# Patient Record
Sex: Male | Born: 1955 | Race: White | Hispanic: No | Marital: Married | Smoking: Never smoker
Health system: Southern US, Community
[De-identification: ages and names within clinical notes are randomized; demographics above are authoritative.]

## PROBLEM LIST (undated history)

## (undated) DIAGNOSIS — C44310 Basal cell carcinoma of skin of unspecified parts of face: Secondary | ICD-10-CM

## (undated) DIAGNOSIS — I10 Essential (primary) hypertension: Secondary | ICD-10-CM

## (undated) DIAGNOSIS — E119 Type 2 diabetes mellitus without complications: Secondary | ICD-10-CM

## (undated) HISTORY — PX: FOOT SURGERY: SHX648

---

## 2006-09-25 ENCOUNTER — Ambulatory Visit (HOSPITAL_COMMUNITY): Admission: RE | Admit: 2006-09-25 | Discharge: 2006-09-25 | Payer: Self-pay | Admitting: Family Medicine

## 2007-10-18 ENCOUNTER — Encounter (INDEPENDENT_AMBULATORY_CARE_PROVIDER_SITE_OTHER): Payer: Self-pay | Admitting: General Surgery

## 2007-10-18 ENCOUNTER — Ambulatory Visit (HOSPITAL_COMMUNITY): Admission: RE | Admit: 2007-10-18 | Discharge: 2007-10-18 | Payer: Self-pay | Admitting: General Surgery

## 2010-07-26 NOTE — Op Note (Signed)
NAME:  Dominic Pace, Dominic Pace                 ACCOUNT NO.:  [id number]   MEDICAL RECORD NO.:  [id number]          PATIENT TYPE:  AMB   LOCATION:  DAY                           FACILITY:  [facility]   PHYSICIAN:  Tilford Pillar, MD      DATE OF BIRTH:  1955-06-10   DATE OF PROCEDURE:  10/18/2007  DATE OF DISCHARGE:                               OPERATIVE REPORT   PRIMARY CARE PHYSICIAN:  [facility].   PREOPERATIVE DIAGNOSIS:  Lipoma of the back.   POSTOPERATIVE DIAGNOSES:  Lipoma of the back.   PROCEDURE:  Excision of lipoma via a 4-cm incision.   SURGEON:  Tilford Pillar, MD   ANESTHESIA:  MAC with local anesthetic.   SPECIMEN:  Lipoma.   ESTIMATED BLOOD LOSS:  Minimal.   INDICATIONS:  The patient is a 55 year old male who presented to my  office as an outpatient with a history of a decreasing mass on his back  slightly left and midline.  This has caused increased discomfort with  lying in a supine position.  On evaluation, he had what appeared to be  clearly a lipoma.  Risks, benefits and alternatives of excision of a  lipoma were discussed at length with the patient including, but not  limited to risk of bleeding, infection, and recurrence.  The patient's  questions and concerns were addressed and the patient was consented for  the planned procedure.   OPERATION:  The patient was taken to the operating room, placed in the  supine position on the operating room table, at which time the sedation  was administered by anesthesia, he was then placed into a right lateral  decubitus position.  The back was prepped with DuraPrep solution and  drapes were placed in standard fashion.  A marking pen was utilized to  mark the planned incision over the underlying lipoma.  An incision was  created with the scalpel.  Additional dissection down his subcuticular  tissue was carried out using electrocautery.  Combination of sharp  electrocautery dissection were utilized to dissect the lipoma free  from  surrounding tissue.  The lipoma was noted to be multilobulated but was  easily able to be removed from the wound from the surrounding soft  tissue.  Once free, it was placed on the back table and was sent as  permanent specimen to pathology.  At this point, hemostasis was obtained  using excision electrocautery and a 3-0 Vicryl and a figure-of-eight  suture with excellent hemostasis.  The wound was irrigated.  The deep  subcuticular tissue was reapproximated using a 3-0 Vicryl in a simple  interrupted fashion and then the skin edges were reapproximated using a  4-0 Monocryl and running subcuticular suture.  The skin was washed and  dried with moist and dry towel.  Benzoin was applied around the  incision.  Half-inch Steri-Strips were placed.  The patient was allowed  to come out of general anesthetic, was allowed to come out of sedation,  was transferred back to a regular hospital bed and transferred to  postanesthetic care unit in stable condition.  At the conclusion of the  procedure, all instrument, sponge, and needle counts were correct.  The  patient tolerated the procedure well.      Tilford Pillar, MD  Electronically Signed     BZ/MEDQ  D:  10/18/2007  T:  10/19/2007  Job:  [id number]   cc:   [facility]

## 2010-12-09 LAB — CBC
MCHC: 33.1
MCV: 91.8
Platelets: 268
RDW: 13.4
WBC: 6.5

## 2010-12-09 LAB — BASIC METABOLIC PANEL
BUN: 11
Chloride: 100
Creatinine, Ser: 0.92
Glucose, Bld: 128 — ABNORMAL HIGH

## 2011-10-18 ENCOUNTER — Other Ambulatory Visit (HOSPITAL_COMMUNITY): Payer: Self-pay | Admitting: Cardiovascular Disease

## 2011-10-18 ENCOUNTER — Ambulatory Visit (HOSPITAL_COMMUNITY)
Admission: RE | Admit: 2011-10-18 | Discharge: 2011-10-18 | Disposition: A | Discharge status: Home / Self Care | Payer: Medicare Other | Source: Ambulatory Visit | Attending: Cardiovascular Disease | Admitting: Cardiovascular Disease

## 2011-10-18 DIAGNOSIS — R079 Chest pain, unspecified: Secondary | ICD-10-CM | POA: Insufficient documentation

## 2011-10-18 DIAGNOSIS — Z01812 Encounter for preprocedural laboratory examination: Secondary | ICD-10-CM

## 2011-10-18 DIAGNOSIS — Z01818 Encounter for other preprocedural examination: Secondary | ICD-10-CM | POA: Insufficient documentation

## 2011-10-19 ENCOUNTER — Other Ambulatory Visit: Payer: Self-pay | Admitting: Cardiovascular Disease

## 2011-10-20 ENCOUNTER — Ambulatory Visit (HOSPITAL_COMMUNITY)
Admission: RE | Admit: 2011-10-20 | Discharge: 2011-10-20 | Disposition: A | Discharge status: Home / Self Care | Payer: Medicare Other | Source: Ambulatory Visit | Attending: Cardiovascular Disease | Admitting: Cardiovascular Disease

## 2011-10-20 ENCOUNTER — Encounter (HOSPITAL_COMMUNITY)
Admission: RE | Disposition: A | Discharge status: Home / Self Care | Payer: Self-pay | Source: Ambulatory Visit | Attending: Cardiovascular Disease

## 2011-10-20 ENCOUNTER — Encounter (HOSPITAL_COMMUNITY): Payer: Self-pay | Admitting: Pharmacy Technician

## 2011-10-20 DIAGNOSIS — I10 Essential (primary) hypertension: Secondary | ICD-10-CM | POA: Insufficient documentation

## 2011-10-20 DIAGNOSIS — I200 Unstable angina: Secondary | ICD-10-CM | POA: Insufficient documentation

## 2011-10-20 DIAGNOSIS — E785 Hyperlipidemia, unspecified: Secondary | ICD-10-CM | POA: Insufficient documentation

## 2011-10-20 DIAGNOSIS — I2510 Atherosclerotic heart disease of native coronary artery without angina pectoris: Secondary | ICD-10-CM | POA: Insufficient documentation

## 2011-10-20 DIAGNOSIS — E119 Type 2 diabetes mellitus without complications: Secondary | ICD-10-CM | POA: Insufficient documentation

## 2011-10-20 HISTORY — PX: LEFT HEART CATHETERIZATION WITH CORONARY ANGIOGRAM: SHX5451

## 2011-10-20 LAB — GLUCOSE, CAPILLARY
Glucose-Capillary: 231 mg/dL — ABNORMAL HIGH (ref 70–99)
Glucose-Capillary: 230 mg/dL — ABNORMAL HIGH (ref 70–99)

## 2011-10-20 SURGERY — LEFT HEART CATHETERIZATION WITH CORONARY ANGIOGRAM
Anesthesia: LOCAL

## 2011-10-20 MED ORDER — FENTANYL CITRATE 0.05 MG/ML IJ SOLN
INTRAMUSCULAR | Status: AC
  Filled 2011-10-20: qty 2

## 2011-10-20 MED ORDER — LIDOCAINE HCL (PF) 1 % IJ SOLN
INTRAMUSCULAR | Status: AC
  Filled 2011-10-20: qty 30

## 2011-10-20 MED ORDER — SODIUM CHLORIDE 0.9 % IV SOLN: 1.0000 mL/kg/h | INTRAVENOUS | Status: DC

## 2011-10-20 MED ORDER — MIDAZOLAM HCL 2 MG/2ML IJ SOLN
INTRAMUSCULAR | Status: AC
  Filled 2011-10-20: qty 2

## 2011-10-20 MED ORDER — SODIUM CHLORIDE 0.9 % IJ SOLN: 3.0000 mL | INTRAMUSCULAR | Status: DC | PRN

## 2011-10-20 MED ORDER — HEPARIN SODIUM (PORCINE) 1000 UNIT/ML IJ SOLN
INTRAMUSCULAR | Status: AC
  Filled 2011-10-20: qty 1

## 2011-10-20 MED ORDER — SODIUM CHLORIDE 0.9 % IV SOLN
INTRAVENOUS | Status: DC
  Administered 2011-10-20: 07:00:00 via INTRAVENOUS

## 2011-10-20 MED ORDER — VERAPAMIL HCL 2.5 MG/ML IV SOLN
INTRAVENOUS | Status: AC
  Filled 2011-10-20: qty 2

## 2011-10-20 MED ORDER — ISOSORBIDE MONONITRATE ER 30 MG PO TB24: 30.0000 mg | ORAL_TABLET | Freq: Every day | ORAL | Status: DC

## 2011-10-20 MED ORDER — ONDANSETRON HCL 4 MG/2ML IJ SOLN: 4.0000 mg | Freq: Four times a day (QID) | INTRAMUSCULAR | Status: DC | PRN

## 2011-10-20 MED ORDER — ASPIRIN 81 MG PO CHEW
CHEWABLE_TABLET | ORAL | Status: AC
  Filled 2011-10-20: qty 4

## 2011-10-20 MED ORDER — ASPIRIN 81 MG PO CHEW
324.0000 mg | CHEWABLE_TABLET | ORAL | Status: AC
  Administered 2011-10-20: 324 mg via ORAL

## 2011-10-20 MED ORDER — NITROGLYCERIN 0.2 MG/ML ON CALL CATH LAB
INTRAVENOUS | Status: AC
  Filled 2011-10-20: qty 1

## 2011-10-20 MED ORDER — HEPARIN (PORCINE) IN NACL 2-0.9 UNIT/ML-% IJ SOLN
INTRAMUSCULAR | Status: AC
  Filled 2011-10-20: qty 2000

## 2011-10-20 MED ORDER — ACETAMINOPHEN 325 MG PO TABS: 650.0000 mg | ORAL_TABLET | ORAL | Status: DC | PRN

## 2011-10-20 NOTE — H&P (Signed)
  Date of Initial H&P: 10/17/2011   This procedure has been fully reviewed with the patient and written informed consent has been obtained. History reviewed, patient examined, no change in status, stable for surgery.  Thurmon Fair, MD, [facility] [phone] office [phone] pager 10/20/2011 8:01 AM

## 2011-10-20 NOTE — CV Procedure (Signed)
Dominic Pace, Dominic Pace Male, 56 y.o., [birth year]  Location: [facility]CATH [facility]  Bed: NONE  MRN: [id number]  CSN: [id number]  CARDIAC CATHETERIZATION REPORT   Procedures performed:  1. Left heart catheterization  2. Selective coronary angiography  3. Left ventriculography   Reason for procedure:  Unstable angina pectoris  Procedure performed by: Thurmon Fair, MD, [facility]  Complications: none   Estimated blood loss: less than 5 mL   History:  This is a 56 year old man with numerous coronary risk factors (diabetes mellitus, systemic hypertension, untreated next dyslipidemia) who has recurrent symptoms highly consistent with angina pectoris. The symptoms have recently shown a pattern of acceleration. Despite a previous normal nuclear perfusion study he is now referred for coronary angiography due to the accelerated pattern of his symptoms.  Consent: The risks, benefits, and details of the procedure were explained to the patient. Risks including death, MI, stroke, bleeding, limb ischemia, renal failure and allergy were described and accepted by the patient. Informed written consent was obtained prior to proceeding.  Technique: The patient was brought to the cardiac catheterization laboratory in the fasting state. He was prepped and draped in the usual sterile fashion. Local anesthesia with 1% lidocaine was administered to the right wrist area. Using the modified Seldinger technique a 5 French right radial artery sheath was introduced without difficulty. Under fluoroscopic guidance, using 5 French TIG, JL 3.5 and angled pigtail catheters, selective cannulation of the left coronary artery, right coronary artery and left ventricle were respectively performed. Several coronary angiograms in a variety of projections were recorded, as well as a left ventriculogram in the RAO projection. Left ventricular pressure and a pull back to the aorta were recorded. No immediate complications occurred. At the end of the procedure,  all catheters were removed. After the procedure, hemostasis will be achieved with manual pressure.  Contrast used: 90 mL Omnipaque Sedation with Versed 4 mg and fentanyl 50 mcg IV  Angiographic Findings:  1. The left main coronary artery is free of significant atherosclerosis and bifurcates in the usual fashion into the left anterior descending artery and left circumflex coronary artery.  2. The left anterior descending artery is a large vessel that reaches the apex and generates four major diagonal branches. The first diagonal branch is very large and has a proximal ostium and falls a trajectory consistent with a ramus intermedius artery. There is evidence of mild to moderate luminal irregularities and no calcification. No hemodynamically meaningful stenoses are seen. There is an approximately 40-50% lesion seen in the proximal LAD artery, downstream of the proximal diagonal and just before the origin of the first septal branch. It is smooth concentric and does not have the features of a typical ulcerated plaque. After administration of intra-coronary nitroglycerin there is still a residual 30-40% lesion. 3. The left circumflex coronary artery is a medium-size vessel non- dominant vessel that generates 3 major oblique marginal arteries, of which only the third is a meaningful size. There is evidence of no luminal irregularities and no calcification. No hemodynamically meaningful stenoses are seen. 4. The right coronary artery is a very large-size dominant vessel that generates a very long posterior lateral ventricular system as well as a large-caliber posterior descending artery that reaches the apex. There is evidence of minimal irregularities and no calcification. No hemodynamically meaningful stenoses are seen.  5. The left ventricle is normal in size. The left ventricle systolic function is normal, with an estimated ejection fraction of 65%. Regional wall motion abnormalities are not seen. No left  ventricular thrombus is seen. There is no mitral insufficiency. The ascending aorta appears normal. There is no aortic valve stenosis by pullback. The left ventricular end-diastolic pressure is 15 mm Hg.    IMPRESSIONS:   [name] is evidence of early coronary atherosclerosis especially noted notable in the proximal LAD artery.. However no hemodynamically important lesions are seen in more important there are no features to suggest an unstable coronary lesion. 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 He has numerous coronary risk factors and not all of these are properly addressed.  RECOMMENDATION:  Empirical treatment with nitrates for what may be endothelial dysfunction and vasospasm on top of early coronary atherosclerosis.  Aggressive treatment of his dyslipidemia with weight loss exercise improved diet and definitely a high dose effective statin.     Thurmon Fair, MD, [facility] [phone] office [phone] pager 10/20/2011 8:58 AM

## 2014-02-19 ENCOUNTER — Encounter (HOSPITAL_COMMUNITY): Payer: Self-pay | Admitting: Cardiovascular Disease

## 2014-04-02 DIAGNOSIS — R0902 Hypoxemia: Secondary | ICD-10-CM | POA: Diagnosis not present

## 2014-04-02 DIAGNOSIS — G4733 Obstructive sleep apnea (adult) (pediatric): Secondary | ICD-10-CM | POA: Diagnosis not present

## 2014-05-03 DIAGNOSIS — G4733 Obstructive sleep apnea (adult) (pediatric): Secondary | ICD-10-CM | POA: Diagnosis not present

## 2014-05-03 DIAGNOSIS — R0902 Hypoxemia: Secondary | ICD-10-CM | POA: Diagnosis not present

## 2014-05-13 DIAGNOSIS — E1165 Type 2 diabetes mellitus with hyperglycemia: Secondary | ICD-10-CM | POA: Diagnosis not present

## 2014-05-13 DIAGNOSIS — I10 Essential (primary) hypertension: Secondary | ICD-10-CM | POA: Diagnosis not present

## 2014-05-13 DIAGNOSIS — Z6841 Body Mass Index (BMI) 40.0 and over, adult: Secondary | ICD-10-CM | POA: Diagnosis not present

## 2014-06-01 DIAGNOSIS — G4733 Obstructive sleep apnea (adult) (pediatric): Secondary | ICD-10-CM | POA: Diagnosis not present

## 2014-06-01 DIAGNOSIS — R0902 Hypoxemia: Secondary | ICD-10-CM | POA: Diagnosis not present

## 2014-07-02 DIAGNOSIS — R0902 Hypoxemia: Secondary | ICD-10-CM | POA: Diagnosis not present

## 2014-07-02 DIAGNOSIS — G4733 Obstructive sleep apnea (adult) (pediatric): Secondary | ICD-10-CM | POA: Diagnosis not present

## 2014-08-01 DIAGNOSIS — G4733 Obstructive sleep apnea (adult) (pediatric): Secondary | ICD-10-CM | POA: Diagnosis not present

## 2014-08-01 DIAGNOSIS — R0902 Hypoxemia: Secondary | ICD-10-CM | POA: Diagnosis not present

## 2014-08-11 DIAGNOSIS — M5410 Radiculopathy, site unspecified: Secondary | ICD-10-CM | POA: Diagnosis not present

## 2014-08-11 DIAGNOSIS — R200 Anesthesia of skin: Secondary | ICD-10-CM | POA: Diagnosis not present

## 2014-08-11 DIAGNOSIS — E119 Type 2 diabetes mellitus without complications: Secondary | ICD-10-CM | POA: Diagnosis not present

## 2014-08-11 DIAGNOSIS — G459 Transient cerebral ischemic attack, unspecified: Secondary | ICD-10-CM | POA: Diagnosis not present

## 2014-08-19 ENCOUNTER — Ambulatory Visit (HOSPITAL_COMMUNITY)
Admission: RE | Admit: 2014-08-19 | Discharge: 2014-08-19 | Disposition: A | Discharge status: Home / Self Care | Payer: Self-pay | Source: Ambulatory Visit | Attending: Physician Assistant | Admitting: Physician Assistant

## 2014-08-19 ENCOUNTER — Other Ambulatory Visit (HOSPITAL_COMMUNITY): Payer: Self-pay | Admitting: Physician Assistant

## 2014-08-19 ENCOUNTER — Ambulatory Visit (HOSPITAL_COMMUNITY)
Admission: RE | Admit: 2014-08-19 | Discharge: 2014-08-19 | Disposition: A | Discharge status: Home / Self Care | Payer: Medicare Other | Source: Ambulatory Visit | Attending: Physician Assistant | Admitting: Physician Assistant

## 2014-08-19 DIAGNOSIS — E118 Type 2 diabetes mellitus with unspecified complications: Secondary | ICD-10-CM | POA: Diagnosis not present

## 2014-08-19 DIAGNOSIS — M5412 Radiculopathy, cervical region: Secondary | ICD-10-CM | POA: Diagnosis not present

## 2014-08-19 DIAGNOSIS — Z0001 Encounter for general adult medical examination with abnormal findings: Secondary | ICD-10-CM | POA: Diagnosis not present

## 2014-08-19 DIAGNOSIS — Z1389 Encounter for screening for other disorder: Secondary | ICD-10-CM | POA: Diagnosis not present

## 2014-08-19 DIAGNOSIS — E119 Type 2 diabetes mellitus without complications: Secondary | ICD-10-CM | POA: Diagnosis not present

## 2014-08-19 DIAGNOSIS — M47812 Spondylosis without myelopathy or radiculopathy, cervical region: Secondary | ICD-10-CM | POA: Diagnosis not present

## 2014-08-19 DIAGNOSIS — Z6839 Body mass index (BMI) 39.0-39.9, adult: Secondary | ICD-10-CM | POA: Diagnosis not present

## 2014-09-01 DIAGNOSIS — G4733 Obstructive sleep apnea (adult) (pediatric): Secondary | ICD-10-CM | POA: Diagnosis not present

## 2014-09-01 DIAGNOSIS — R0902 Hypoxemia: Secondary | ICD-10-CM | POA: Diagnosis not present

## 2015-01-14 DIAGNOSIS — Z1389 Encounter for screening for other disorder: Secondary | ICD-10-CM | POA: Diagnosis not present

## 2015-01-14 DIAGNOSIS — I10 Essential (primary) hypertension: Secondary | ICD-10-CM | POA: Diagnosis not present

## 2015-01-14 DIAGNOSIS — E782 Mixed hyperlipidemia: Secondary | ICD-10-CM | POA: Diagnosis not present

## 2015-01-14 DIAGNOSIS — E1165 Type 2 diabetes mellitus with hyperglycemia: Secondary | ICD-10-CM | POA: Diagnosis not present

## 2015-01-14 DIAGNOSIS — Z6839 Body mass index (BMI) 39.0-39.9, adult: Secondary | ICD-10-CM | POA: Diagnosis not present

## 2015-06-16 DIAGNOSIS — Z1389 Encounter for screening for other disorder: Secondary | ICD-10-CM | POA: Diagnosis not present

## 2015-06-16 DIAGNOSIS — E1121 Type 2 diabetes mellitus with diabetic nephropathy: Secondary | ICD-10-CM | POA: Diagnosis not present

## 2015-06-16 DIAGNOSIS — I10 Essential (primary) hypertension: Secondary | ICD-10-CM | POA: Diagnosis not present

## 2015-06-16 DIAGNOSIS — E1165 Type 2 diabetes mellitus with hyperglycemia: Secondary | ICD-10-CM | POA: Diagnosis not present

## 2015-08-04 DIAGNOSIS — L239 Allergic contact dermatitis, unspecified cause: Secondary | ICD-10-CM | POA: Diagnosis not present

## 2015-12-02 DIAGNOSIS — Z1389 Encounter for screening for other disorder: Secondary | ICD-10-CM | POA: Diagnosis not present

## 2015-12-02 DIAGNOSIS — H6123 Impacted cerumen, bilateral: Secondary | ICD-10-CM | POA: Diagnosis not present

## 2015-12-02 DIAGNOSIS — I10 Essential (primary) hypertension: Secondary | ICD-10-CM | POA: Diagnosis not present

## 2015-12-02 DIAGNOSIS — E1129 Type 2 diabetes mellitus with other diabetic kidney complication: Secondary | ICD-10-CM | POA: Diagnosis not present

## 2015-12-02 DIAGNOSIS — E1165 Type 2 diabetes mellitus with hyperglycemia: Secondary | ICD-10-CM | POA: Diagnosis not present

## 2015-12-02 DIAGNOSIS — E782 Mixed hyperlipidemia: Secondary | ICD-10-CM | POA: Diagnosis not present

## 2016-05-18 DIAGNOSIS — I10 Essential (primary) hypertension: Secondary | ICD-10-CM | POA: Diagnosis not present

## 2016-05-18 DIAGNOSIS — E1165 Type 2 diabetes mellitus with hyperglycemia: Secondary | ICD-10-CM | POA: Diagnosis not present

## 2016-11-30 ENCOUNTER — Other Ambulatory Visit (HOSPITAL_COMMUNITY): Payer: Self-pay | Admitting: Internal Medicine

## 2016-11-30 ENCOUNTER — Ambulatory Visit (HOSPITAL_COMMUNITY)
Admission: RE | Admit: 2016-11-30 | Discharge: 2016-11-30 | Disposition: A | Discharge status: Home / Self Care | Payer: Medicare Other | Source: Ambulatory Visit | Attending: Internal Medicine | Admitting: Internal Medicine

## 2016-11-30 DIAGNOSIS — I10 Essential (primary) hypertension: Secondary | ICD-10-CM | POA: Diagnosis not present

## 2016-11-30 DIAGNOSIS — M545 Low back pain: Secondary | ICD-10-CM | POA: Insufficient documentation

## 2016-11-30 DIAGNOSIS — E1165 Type 2 diabetes mellitus with hyperglycemia: Secondary | ICD-10-CM | POA: Diagnosis not present

## 2016-11-30 DIAGNOSIS — R52 Pain, unspecified: Secondary | ICD-10-CM

## 2016-11-30 DIAGNOSIS — D485 Neoplasm of uncertain behavior of skin: Secondary | ICD-10-CM | POA: Diagnosis not present

## 2016-12-28 ENCOUNTER — Ambulatory Visit (HOSPITAL_COMMUNITY)
Admission: RE | Admit: 2016-12-28 | Discharge: 2016-12-28 | Disposition: A | Discharge status: Home / Self Care | Payer: Medicare Other | Source: Ambulatory Visit | Attending: Physician Assistant | Admitting: Physician Assistant

## 2016-12-28 ENCOUNTER — Other Ambulatory Visit (HOSPITAL_COMMUNITY): Payer: Self-pay | Admitting: Physician Assistant

## 2016-12-28 DIAGNOSIS — M542 Cervicalgia: Secondary | ICD-10-CM | POA: Diagnosis not present

## 2016-12-28 DIAGNOSIS — M25552 Pain in left hip: Secondary | ICD-10-CM

## 2016-12-28 DIAGNOSIS — Z1389 Encounter for screening for other disorder: Secondary | ICD-10-CM | POA: Insufficient documentation

## 2016-12-28 DIAGNOSIS — H6123 Impacted cerumen, bilateral: Secondary | ICD-10-CM | POA: Diagnosis not present

## 2016-12-28 DIAGNOSIS — C44319 Basal cell carcinoma of skin of other parts of face: Secondary | ICD-10-CM | POA: Diagnosis not present

## 2016-12-28 DIAGNOSIS — L821 Other seborrheic keratosis: Secondary | ICD-10-CM | POA: Diagnosis not present

## 2016-12-28 DIAGNOSIS — D225 Melanocytic nevi of trunk: Secondary | ICD-10-CM | POA: Diagnosis not present

## 2017-04-17 DIAGNOSIS — E1129 Type 2 diabetes mellitus with other diabetic kidney complication: Secondary | ICD-10-CM | POA: Diagnosis not present

## 2017-04-17 DIAGNOSIS — Z1389 Encounter for screening for other disorder: Secondary | ICD-10-CM | POA: Diagnosis not present

## 2017-04-17 DIAGNOSIS — E118 Type 2 diabetes mellitus with unspecified complications: Secondary | ICD-10-CM | POA: Diagnosis not present

## 2017-04-17 DIAGNOSIS — Z0001 Encounter for general adult medical examination with abnormal findings: Secondary | ICD-10-CM | POA: Diagnosis not present

## 2017-04-17 DIAGNOSIS — Z0000 Encounter for general adult medical examination without abnormal findings: Secondary | ICD-10-CM | POA: Diagnosis not present

## 2017-04-26 DIAGNOSIS — Z0000 Encounter for general adult medical examination without abnormal findings: Secondary | ICD-10-CM | POA: Diagnosis not present

## 2017-05-15 DIAGNOSIS — H6123 Impacted cerumen, bilateral: Secondary | ICD-10-CM | POA: Diagnosis not present

## 2017-06-12 DIAGNOSIS — M545 Low back pain: Secondary | ICD-10-CM | POA: Diagnosis not present

## 2017-06-12 DIAGNOSIS — S39012A Strain of muscle, fascia and tendon of lower back, initial encounter: Secondary | ICD-10-CM | POA: Diagnosis not present

## 2017-07-18 DIAGNOSIS — E1165 Type 2 diabetes mellitus with hyperglycemia: Secondary | ICD-10-CM | POA: Diagnosis not present

## 2017-07-18 DIAGNOSIS — M25861 Other specified joint disorders, right knee: Secondary | ICD-10-CM | POA: Diagnosis not present

## 2017-11-29 DIAGNOSIS — L989 Disorder of the skin and subcutaneous tissue, unspecified: Secondary | ICD-10-CM | POA: Diagnosis not present

## 2017-11-29 DIAGNOSIS — E119 Type 2 diabetes mellitus without complications: Secondary | ICD-10-CM | POA: Diagnosis not present

## 2018-05-14 DIAGNOSIS — Z0000 Encounter for general adult medical examination without abnormal findings: Secondary | ICD-10-CM | POA: Diagnosis not present

## 2018-05-14 DIAGNOSIS — Z1389 Encounter for screening for other disorder: Secondary | ICD-10-CM | POA: Diagnosis not present

## 2018-05-14 DIAGNOSIS — E1165 Type 2 diabetes mellitus with hyperglycemia: Secondary | ICD-10-CM | POA: Diagnosis not present

## 2018-05-14 DIAGNOSIS — Z0001 Encounter for general adult medical examination with abnormal findings: Secondary | ICD-10-CM | POA: Diagnosis not present

## 2018-08-02 ENCOUNTER — Other Ambulatory Visit: Payer: Self-pay

## 2018-08-02 NOTE — Patient Outreach (Signed)
[place] [facility]) Care Management  08/02/2018  Dominic Pace Jul 24, 1955 [id number]   Medication Adherence call to Mr. Dominic Pace HIPPA Compliant Voice message left with a call back number. [name] is showing past due on Metformin 500 mg, Glimepiride 4 mg and Lisinopril 20 mg under [place].   [place] Management Direct Dial [phone]  Fax [phone] [email or web address]

## 2018-08-14 ENCOUNTER — Other Ambulatory Visit: Payer: Self-pay

## 2018-08-14 NOTE — Patient Outreach (Signed)
[facility]) Care Management  08/14/2018  FIORE DETJEN 05-Mar-1956 [id number]   Medication Adherence call to Mr. Ajani Rineer HIPPA Compliant Voice message left with a call back number. Mr. Propes is showing past due under [place].   [place] Management Direct Dial [phone]  Fax [phone] [email or web address]

## 2018-11-15 ENCOUNTER — Other Ambulatory Visit: Payer: Self-pay

## 2018-11-15 NOTE — Patient Outreach (Signed)
[place] [facility]) Care Management  11/15/2018  MARSALIS GIULIANI [birth year] [id number]   Medication Adherence call to Mr. Macklen Whidbee HIPPA Compliant Voice message left with a call back number. Mr. Arrieta is showing past due on Lisinopril 20 mg and Glimepiride 4 mg under [place].  [place] Management Direct Dial [phone]  Fax [phone] [email or web address]

## 2018-11-19 DIAGNOSIS — H6123 Impacted cerumen, bilateral: Secondary | ICD-10-CM | POA: Diagnosis not present

## 2018-11-19 DIAGNOSIS — I10 Essential (primary) hypertension: Secondary | ICD-10-CM | POA: Diagnosis not present

## 2018-11-19 DIAGNOSIS — E1140 Type 2 diabetes mellitus with diabetic neuropathy, unspecified: Secondary | ICD-10-CM | POA: Diagnosis not present

## 2018-11-20 DIAGNOSIS — E1140 Type 2 diabetes mellitus with diabetic neuropathy, unspecified: Secondary | ICD-10-CM | POA: Diagnosis not present

## 2018-11-20 DIAGNOSIS — E1121 Type 2 diabetes mellitus with diabetic nephropathy: Secondary | ICD-10-CM | POA: Diagnosis not present

## 2019-05-10 IMAGING — DX DG LUMBAR SPINE 2-3V
3 series · 3 of 3 positions shown · non-contrast
Comparison: None

CLINICAL DATA: Low back pain.

EXAM:
LUMBAR SPINE - 2-3 VIEW

[l-spine ap]
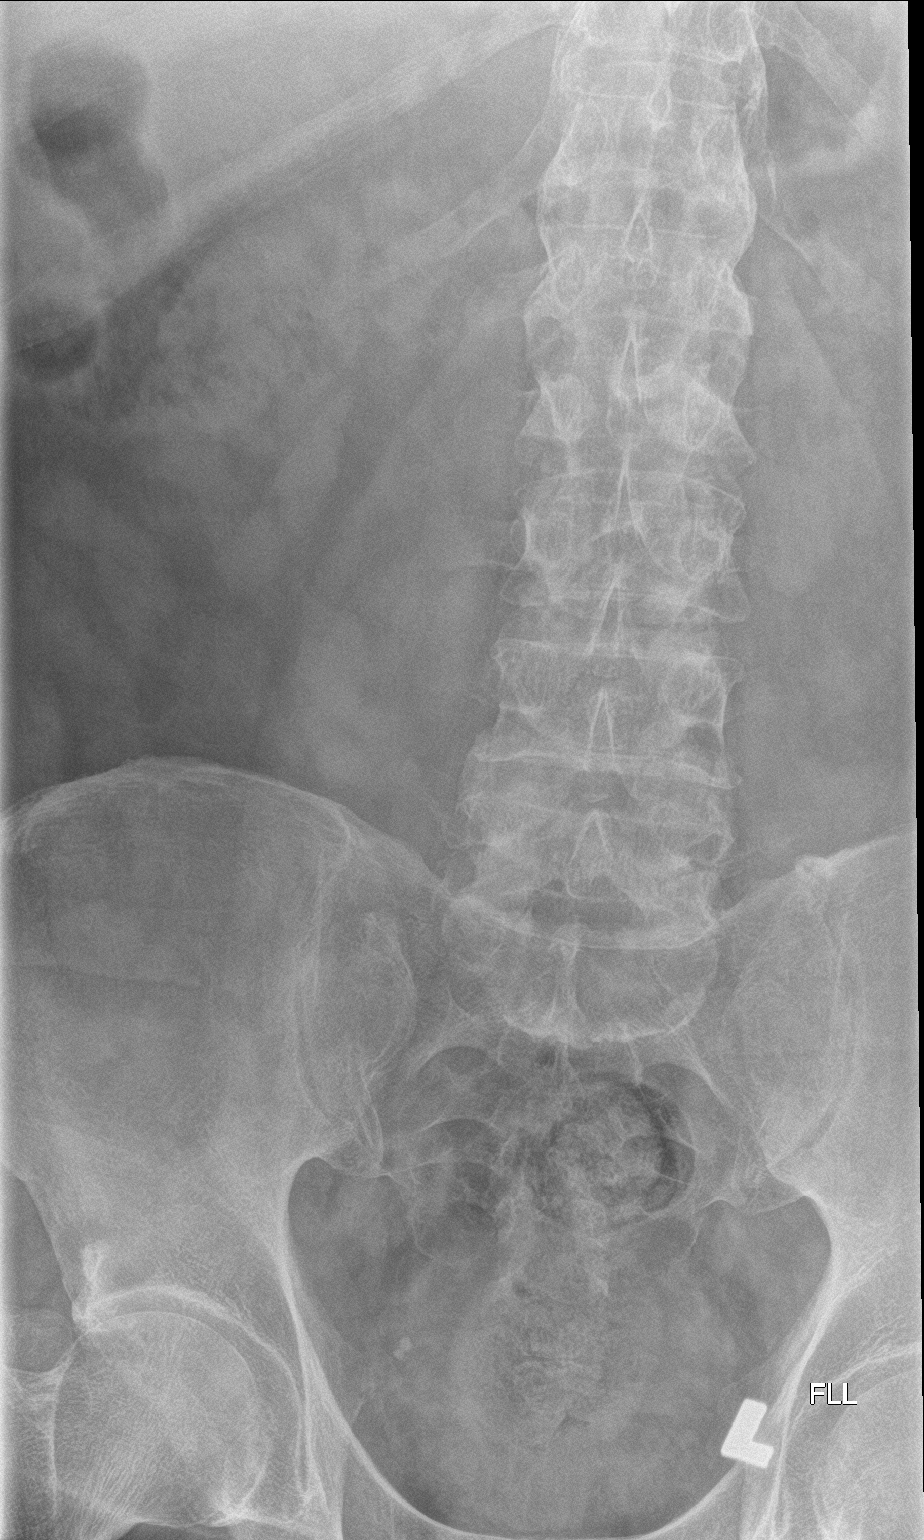

[l-spine lat]
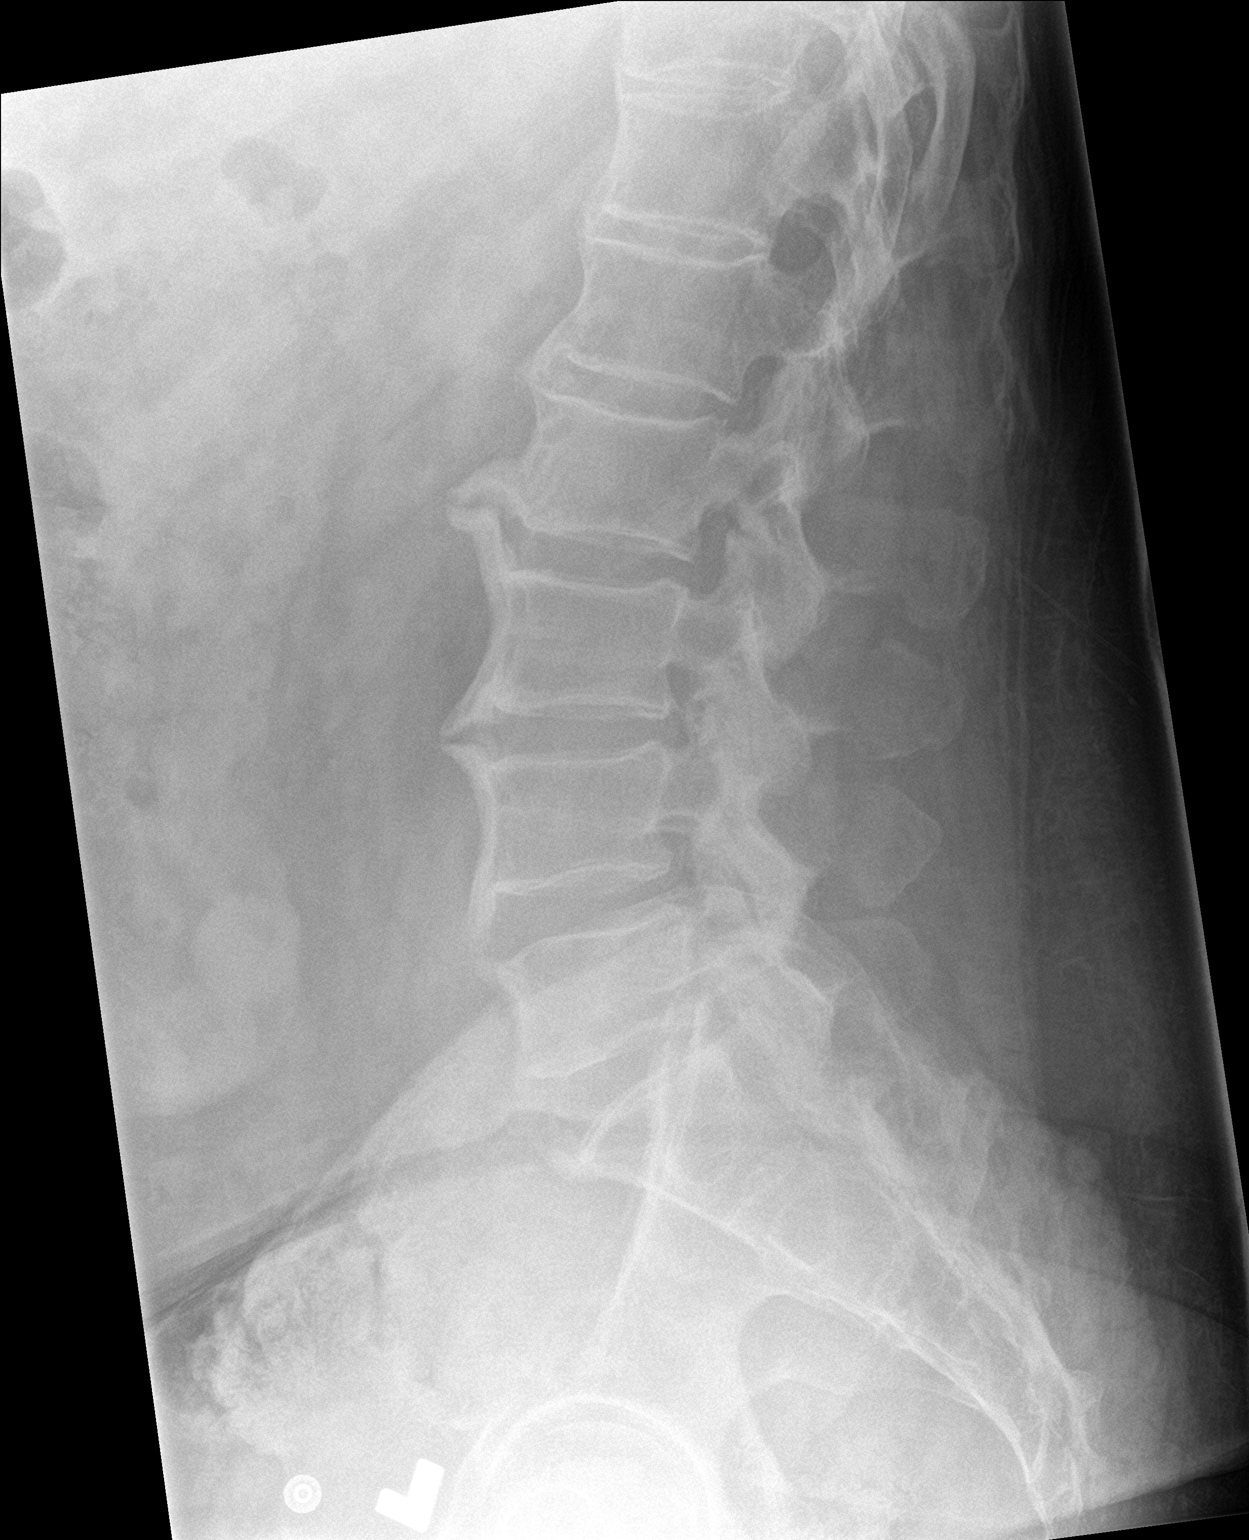

[l-spine spot]
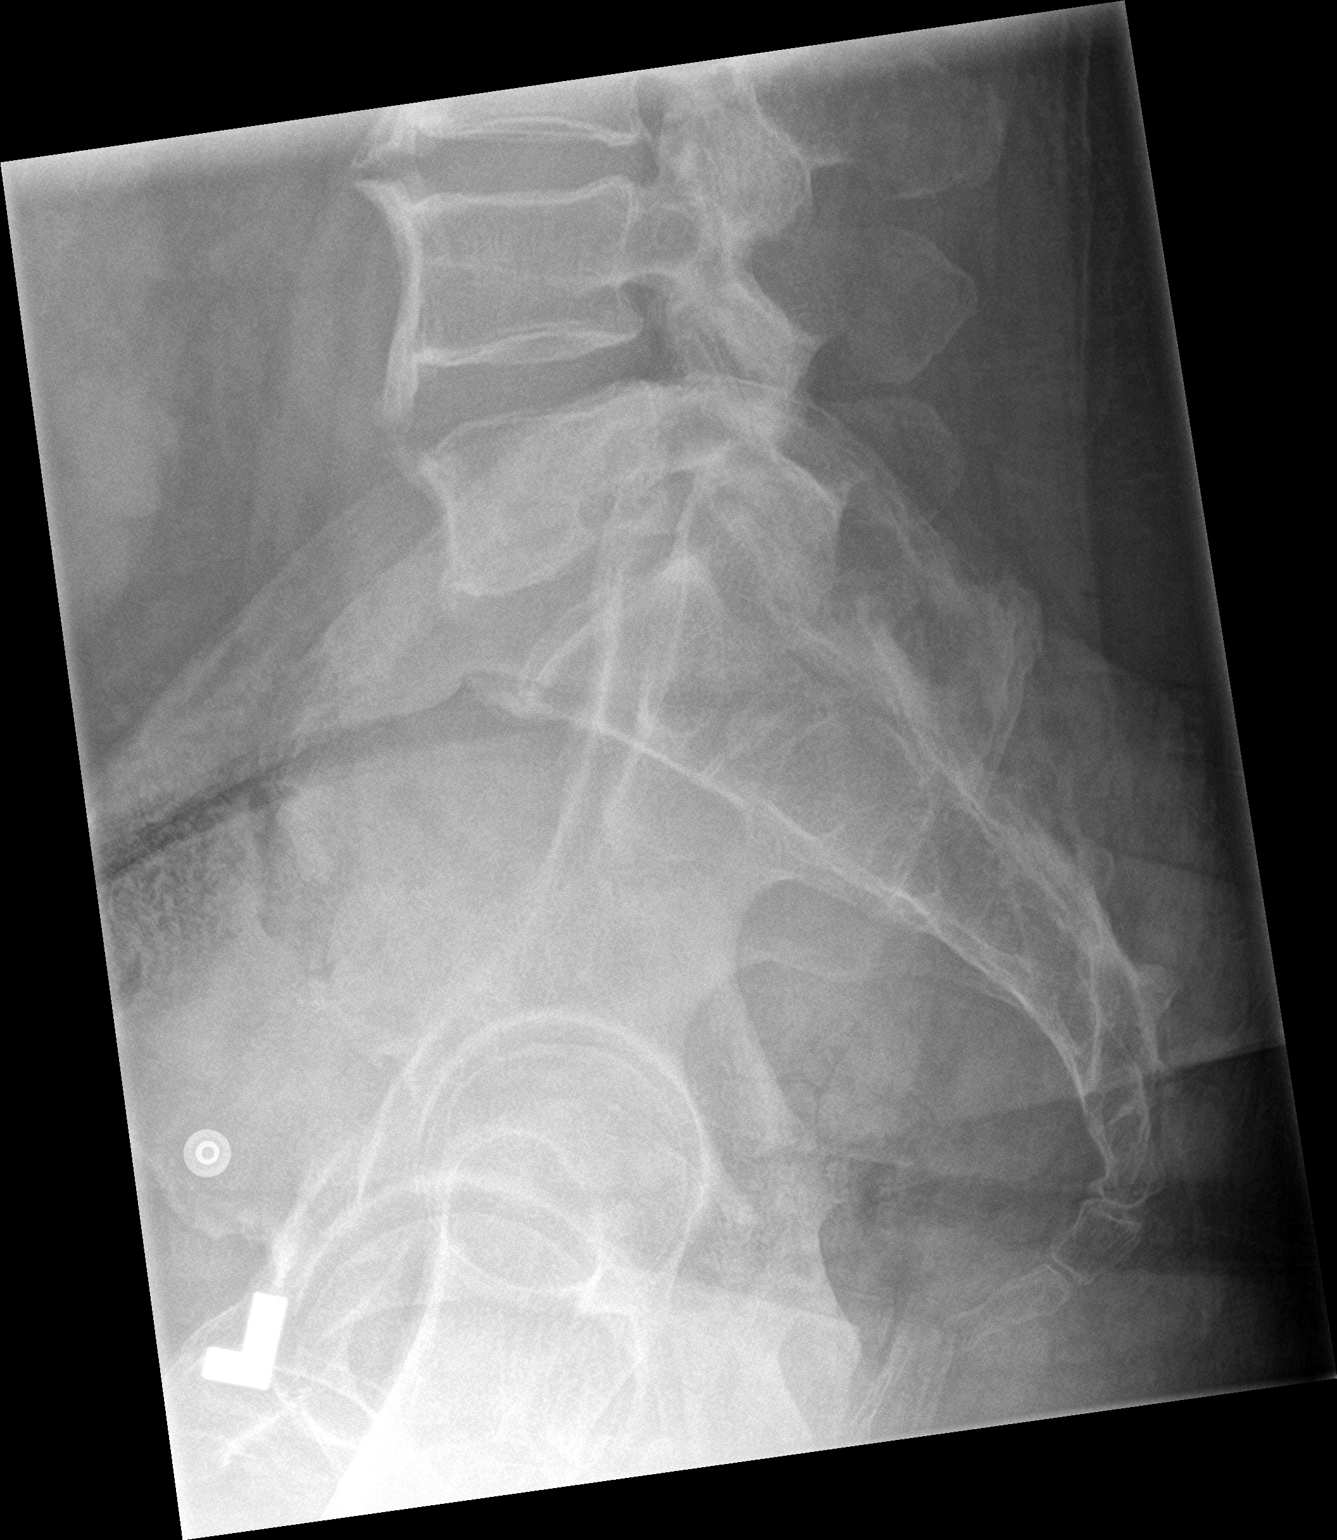

[3 of 3 positions shown; findings below may reference images not displayed]

FINDINGS: The alignment of the lumbar spine appears normal. The vertebral body
heights are well preserved. There is no fracture or subluxation.
Large ventral flowing syndesmophytes are identified within the
lumbar spine.
IMPRESSION: 1. No acute bone abnormality.
2. Disc spaces well preserved with multi level ventral flowing
syndesmophytes suggestive of DISH, diffuse idiopathic skeletal
hyperostosis.

## 2019-06-02 ENCOUNTER — Other Ambulatory Visit: Payer: Self-pay

## 2019-06-02 NOTE — Patient Outreach (Signed)
[facility] [facility]) Care Management  06/02/2019  Dominic Pace December 10, 1955 [id number]   Medication Adherence call to Dominic Pace HIPPA Compliant Voice message left with a call back number. Dominic Pace is showing past due on Lisinopril 20 mg under [place].   [place] Management Direct Dial [phone]  Fax [phone] [name]

## 2019-06-07 IMAGING — DX DG HIP (WITH OR WITHOUT PELVIS) 2-3V*L*
3 series · 4 of 4 positions shown · non-contrast
Comparison: None.

CLINICAL DATA: LEFT posterior hip pain.

EXAM:
DG HIP (WITH OR WITHOUT PELVIS) 2-3V LEFT

[Series 1: pelvis ap · 0.14mm/px · 2 of 2 slices shown]
[im 1/2]
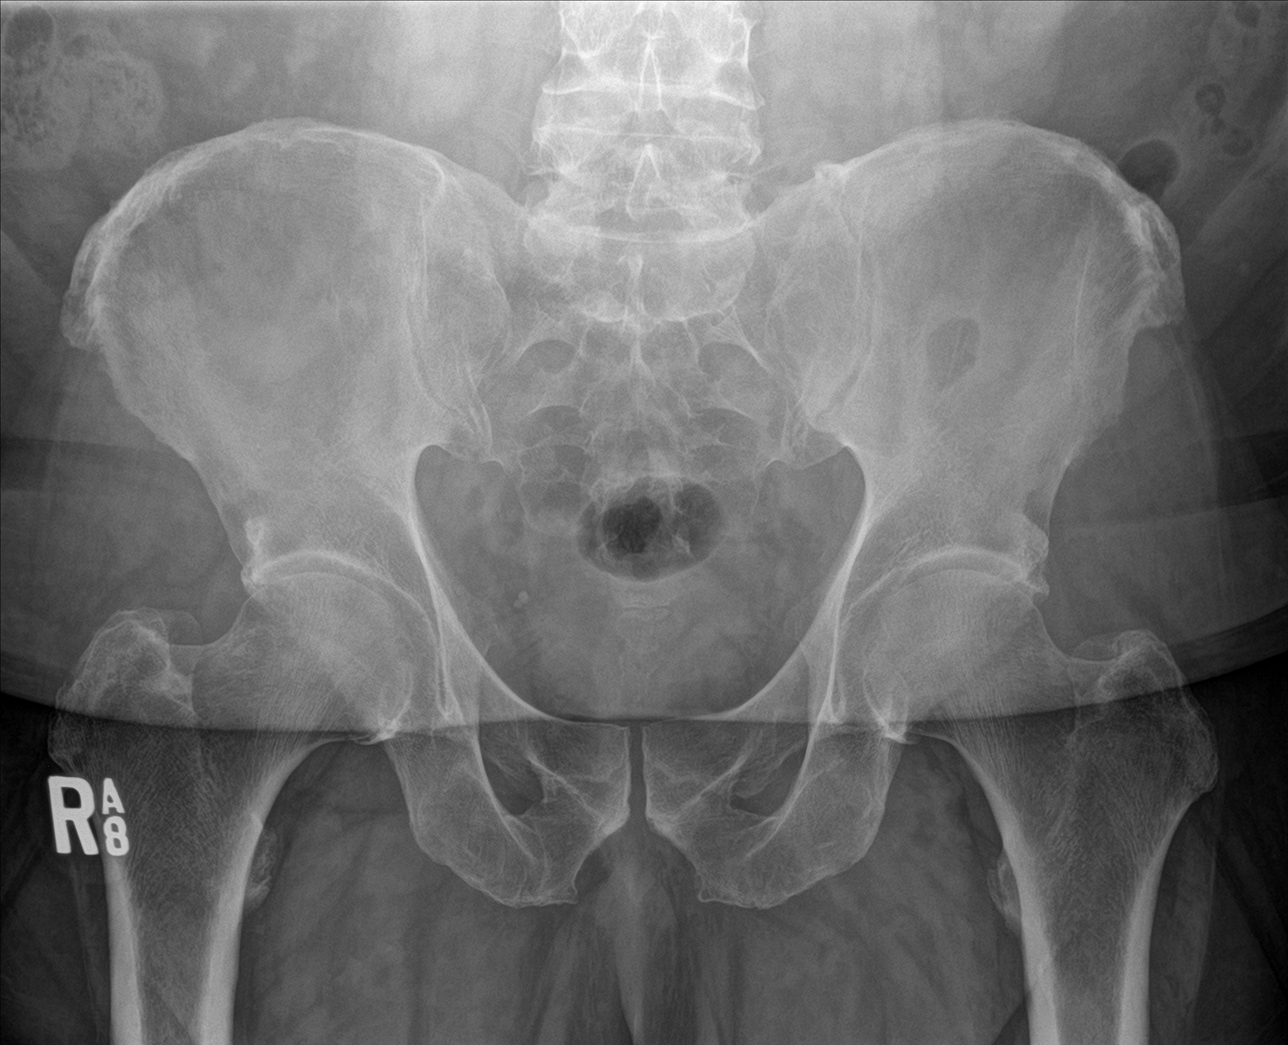
[im 2/2]
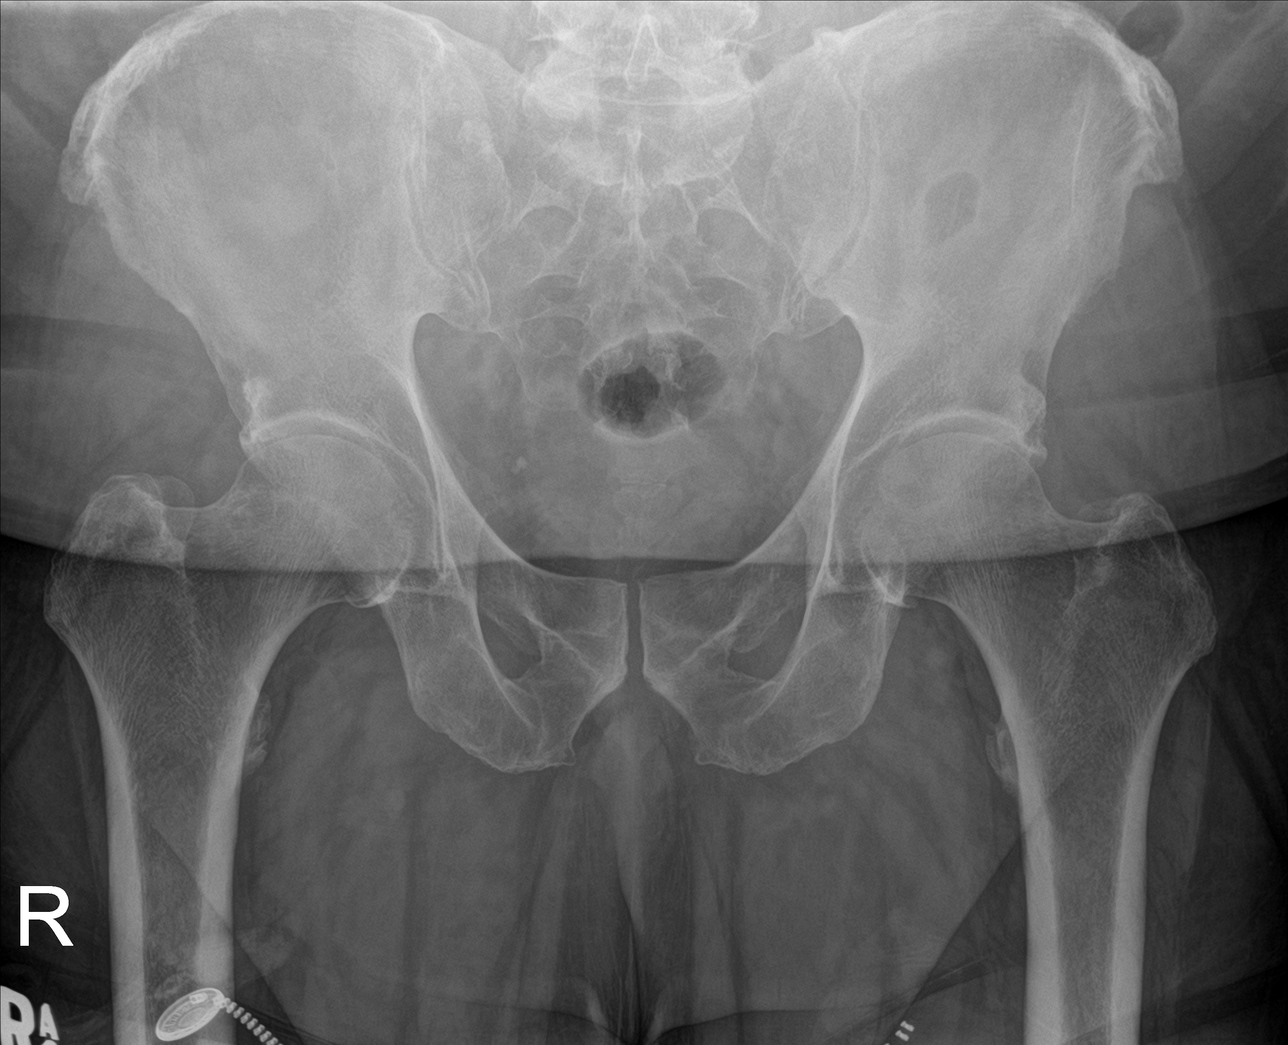

[hip ap]
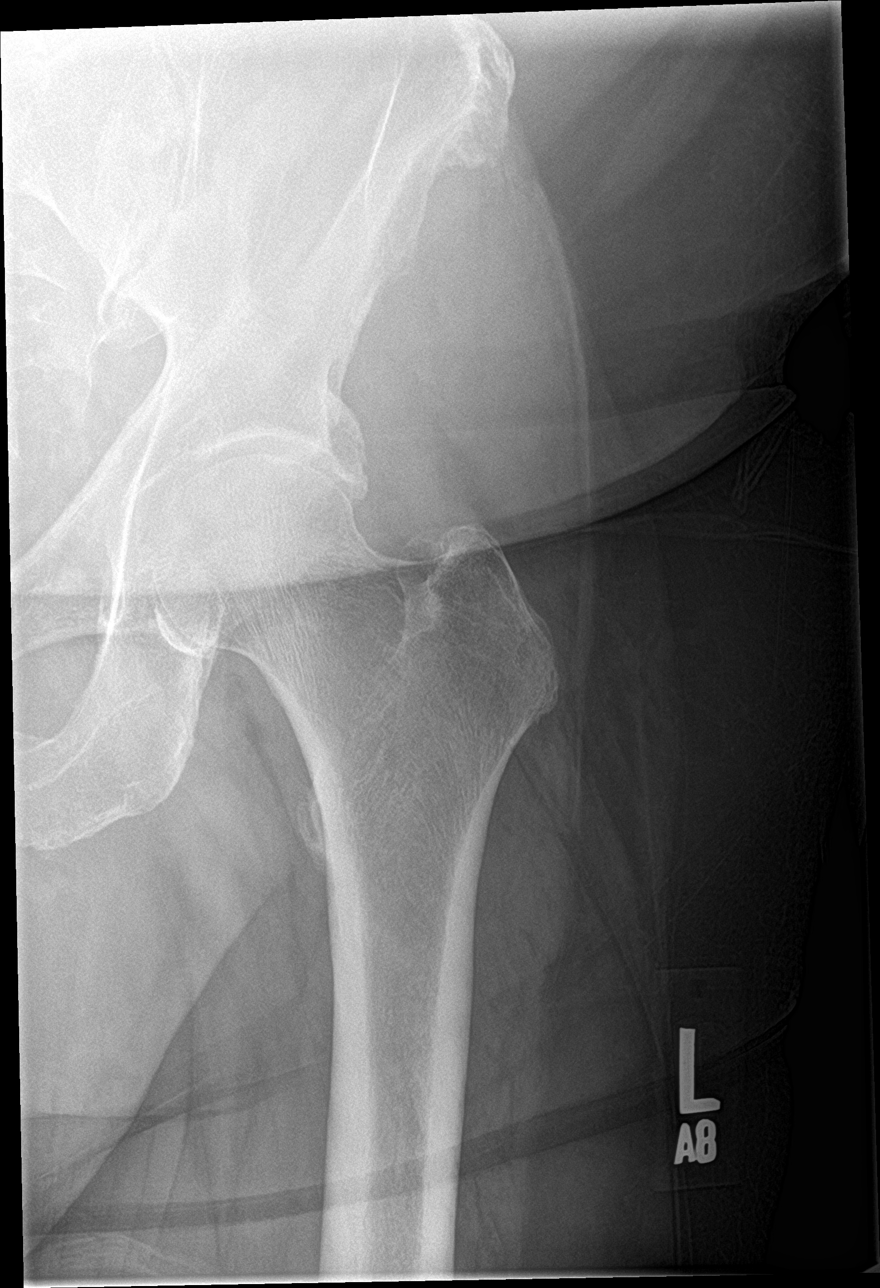

[hip lat]
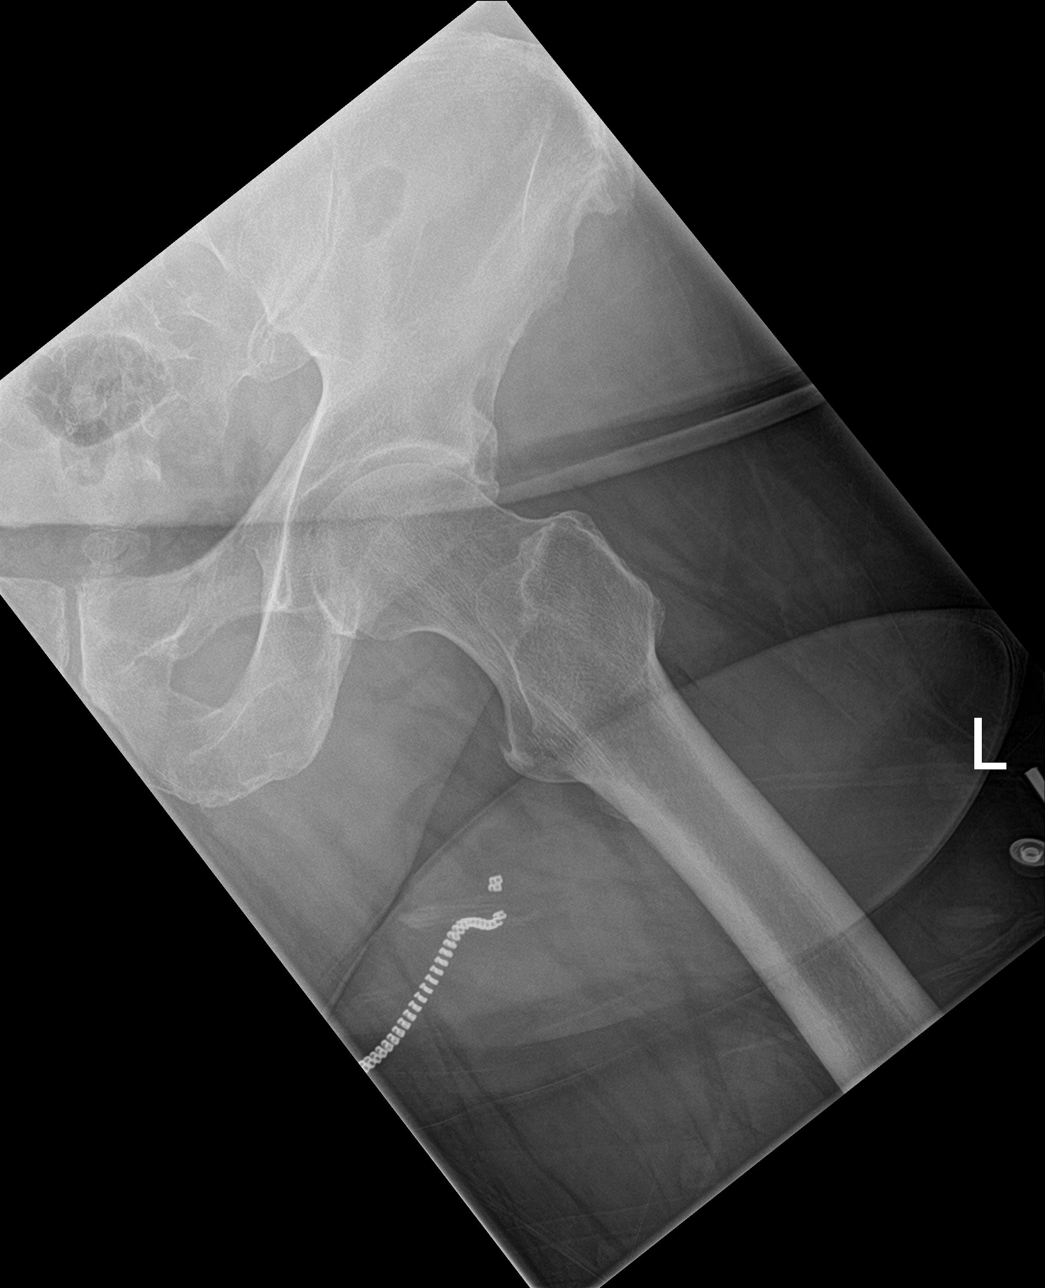

[4 of 4 positions shown; findings below may reference images not displayed]

FINDINGS: There is no evidence of hip fracture or dislocation. There is no
evidence of arthropathy or other focal bone abnormality.
IMPRESSION: Negative.

## 2020-04-19 DIAGNOSIS — Z681 Body mass index (BMI) 19 or less, adult: Secondary | ICD-10-CM | POA: Diagnosis not present

## 2020-04-19 DIAGNOSIS — J029 Acute pharyngitis, unspecified: Secondary | ICD-10-CM | POA: Diagnosis not present

## 2020-05-18 ENCOUNTER — Other Ambulatory Visit: Payer: Self-pay

## 2020-05-18 ENCOUNTER — Ambulatory Visit (INDEPENDENT_AMBULATORY_CARE_PROVIDER_SITE_OTHER): Payer: Medicare Other | Admitting: Internal Medicine

## 2020-05-18 ENCOUNTER — Encounter (INDEPENDENT_AMBULATORY_CARE_PROVIDER_SITE_OTHER): Payer: Self-pay | Admitting: Internal Medicine

## 2020-05-18 ENCOUNTER — Other Ambulatory Visit (INDEPENDENT_AMBULATORY_CARE_PROVIDER_SITE_OTHER): Payer: Self-pay

## 2020-05-18 ENCOUNTER — Telehealth (INDEPENDENT_AMBULATORY_CARE_PROVIDER_SITE_OTHER): Payer: Self-pay

## 2020-05-18 ENCOUNTER — Encounter (INDEPENDENT_AMBULATORY_CARE_PROVIDER_SITE_OTHER): Payer: Self-pay

## 2020-05-18 DIAGNOSIS — Z8719 Personal history of other diseases of the digestive system: Secondary | ICD-10-CM | POA: Insufficient documentation

## 2020-05-18 DIAGNOSIS — R1319 Other dysphagia: Secondary | ICD-10-CM

## 2020-05-18 DIAGNOSIS — E669 Obesity, unspecified: Secondary | ICD-10-CM | POA: Insufficient documentation

## 2020-05-18 DIAGNOSIS — E119 Type 2 diabetes mellitus without complications: Secondary | ICD-10-CM | POA: Insufficient documentation

## 2020-05-18 DIAGNOSIS — I10 Essential (primary) hypertension: Secondary | ICD-10-CM

## 2020-05-18 DIAGNOSIS — Z1211 Encounter for screening for malignant neoplasm of colon: Secondary | ICD-10-CM

## 2020-05-18 MED ORDER — PEG 3350-KCL-NA BICARB-NACL 420 G PO SOLR: 4000.0000 mL | ORAL | 0 refills | Status: DC

## 2020-05-18 NOTE — Patient Instructions (Signed)
Esophagogastroduodenoscopy with esophageal dilation and colonoscopy to be scheduled in near future. Remember to stop aspirin 2 days prior to procedure

## 2020-05-18 NOTE — Telephone Encounter (Signed)
LeighAnn Tidus [place], CMA  

## 2020-05-18 NOTE — Progress Notes (Signed)
Presenting complaint;  Dysphagia.  History of present illness  Patient is 65 year old Caucasian male who is referred through courtesy of Collene Mares, PA-C of [facility] for evaluation of dysphagia. Patient has history of erosive reflux esophagitis complicated by esophageal stricture for which he underwent esophageal dilation 3 times in the past most recently in March 2011 who presents with 22-month history of dysphagia.  Dysphagia has not experienced daily.  He has dysphagia primarily with liquids.  It generally occurs in the morning with the first swallow which results in fleeting chest pain.  He has no difficulty with subsequent swallows but he may have recurrence of dysphagia and chest pain later in the afternoon.  He has no difficulty with solids.  He denies coughing spells or shortness of breath with his dysphagia.  He states he ate roast yesterday and did not have any problems.  He states he has not had heartburn in at least 5 years.  He has not taken PPI in several years.  He states he took probiotic as well as prebiotic for 6 months 5 to 6 years ago and it helps with his GERD symptoms. His appetite is good.  His weight has fluctuated but has remained in narrow range this year. His bowels move regularly.  He denies melena or rectal bleeding. He is interested in screening colonoscopy.  His last exam was in March 2011. He states his peak weight for years ago was 317 pounds and he managed to lose down to 239 pounds 1 year ago.  Since then his weight has gone up to 257 pounds today.  Patient said he is not able to do much walking because of chronic foot pain.  He is planning to avail membership to [employer] which is included in his new insurance plan. Patient has not received COVID vaccine.   Current Medications: Outpatient Encounter Medications as of 05/18/2020  Medication Sig  . aspirin 81 MG EC tablet Take 81 mg by mouth daily.  Marland Kitchen glimepiride (AMARYL) 4 MG tablet Take 4 mg by mouth daily.  Marland Kitchen  lisinopril (PRINIVIL,ZESTRIL) 20 MG tablet Take 20 mg by mouth daily.  . metFORMIN (GLUCOPHAGE) 1000 MG tablet Take 1,000 mg by mouth 2 (two) times daily with a meal.  . carvedilol (COREG) 6.25 MG tablet Take 6.25 mg by mouth 2 (two) times daily with a meal. (Patient not taking: Reported on 05/18/2020)  . isosorbide mononitrate (IMDUR) 30 MG 24 hr tablet Take 1 tablet (30 mg total) by mouth daily.  . [DISCONTINUED] linagliptin (TRADJENTA) 5 MG TABS tablet Take 5 mg by mouth daily. (Patient not taking: Reported on 05/18/2020)  . [DISCONTINUED] nitroGLYCERIN (NITROSTAT) 0.4 MG SL tablet Place 0.4 mg under the tongue every 5 (five) minutes as needed. For chest pain. (Patient not taking: Reported on 05/18/2020)   No facility-administered encounter medications on file as of 05/18/2020.   Past medical history  History of erosive reflux esophagitis complicated by esophageal stricture which was dilated in October 2019, April 2010 and March 2011.  Esophageal biopsy revealed typical changes of esophagitis but no Barrett's or eosinophilic esophagitis.  Type 2 diabetes mellitus Hypertension. Obesity. Normal screening colonoscopy in March 2011. Chronic bilateral foot pain. Removal of sesamoid bone from right foot in June 1994 and redo in June 1995 Removal of sesamoid bone or bones from left foot in June 1995.  Allergies  Allergies  Allergen Reactions  . Percocet [Oxycodone-Acetaminophen] Nausea And Vomiting   Family history  Mother was diagnosed with lung carcinoma at  age 6 and died 68 years later at age 66.  He has no information about his father. He does not have any siblings.  Social history  He is married.  He has grown up son and daughter in good health. He has never smoked cigarettes and does not drink alcohol.  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  Disability ended at  retirement age of 40.  Physical examination  Blood pressure 128/78, pulse 91, temperature 98.1 F (36.7 C), temperature source Oral, height 5\' 8"  (1.727 m), weight 257 lb 1.9 oz (116.6 kg). Patient is alert and in no acute distress. He is wearing a mask. Conjunctiva is pink. Sclera is nonicteric Oropharyngeal mucosa is normal. He has upper and lower dentures in place. No neck masses or thyromegaly noted. Cardiac exam with regular rhythm normal S1 and S2. No murmur or gallop noted. Lungs are clear to auscultation. Abdomen is full but soft and nontender with organomegaly or masses. No LE edema or clubbing noted.  Labs/studies Results: Hemoglobin A1c 8.0 on 01/05/2020. No other labs available for review.  Assessment:  #1.  Esophageal dysphagia.  Patient is 65 year old Caucasian male who has history of erosive reflux esophagitis complicated by esophageal stricture balloon dilated and 2009, 2010 and 2011 who now presented with dysphagia primarily to liquids and he also has odynophagia.  Surprisingly he is not having any difficulty with solids.  Although his presentation is somewhat unusual I suspect he has recurrence of stricture and and I suspect he has esophagitis even though he is not having heartburn.  He certainly could have esophageal motility disorder.  He does not have oropharyngeal dysphagia.  #2.  Patient is average risk for colorectal carcinoma.  Last colonoscopy was 11 years ago.  Patient is interested in proceeding with colonoscopy at the time of EGD.   Recommendations  Esophagogastroduodenoscopy with esophageal dilation followed by average her screening colonoscopy near future with propofol. Procedure risks reviewed the patient and he is agreeable.

## 2020-05-18 NOTE — H&P (View-Only) (Signed)
Presenting complaint;  Dysphagia.  History of present illness  Patient is 65 year old Caucasian male who is referred through courtesy of Collene Mares, PA-C of [facility] for evaluation of dysphagia. Patient has history of erosive reflux esophagitis complicated by esophageal stricture for which he underwent esophageal dilation 3 times in the past most recently in March 2011 who presents with 22-month history of dysphagia.  Dysphagia has not experienced daily.  He has dysphagia primarily with liquids.  It generally occurs in the morning with the first swallow which results in fleeting chest pain.  He has no difficulty with subsequent swallows but he may have recurrence of dysphagia and chest pain later in the afternoon.  He has no difficulty with solids.  He denies coughing spells or shortness of breath with his dysphagia.  He states he ate roast yesterday and did not have any problems.  He states he has not had heartburn in at least 5 years.  He has not taken PPI in several years.  He states he took probiotic as well as prebiotic for 6 months 5 to 6 years ago and it helps with his GERD symptoms. His appetite is good.  His weight has fluctuated but has remained in narrow range this year. His bowels move regularly.  He denies melena or rectal bleeding. He is interested in screening colonoscopy.  His last exam was in March 2011. He states his peak weight for years ago was 317 pounds and he managed to lose down to 239 pounds 1 year ago.  Since then his weight has gone up to 257 pounds today.  Patient said he is not able to do much walking because of chronic foot pain.  He is planning to [facility] membership to [employer] which is included in his new insurance plan. Patient has not received COVID vaccine.   Current Medications: Outpatient Encounter Medications as of 05/18/2020  Medication Sig  . aspirin 81 MG EC tablet Take 81 mg by mouth daily.  Marland Kitchen glimepiride (AMARYL) 4 MG tablet Take 4 mg by mouth daily.  Marland Kitchen  lisinopril (PRINIVIL,ZESTRIL) 20 MG tablet Take 20 mg by mouth daily.  . metFORMIN (GLUCOPHAGE) 1000 MG tablet Take 1,000 mg by mouth 2 (two) times daily with a meal.  . carvedilol (COREG) 6.25 MG tablet Take 6.25 mg by mouth 2 (two) times daily with a meal. (Patient not taking: Reported on 05/18/2020)  . isosorbide mononitrate (IMDUR) 30 MG 24 hr tablet Take 1 tablet (30 mg total) by mouth daily.  . [DISCONTINUED] linagliptin (TRADJENTA) 5 MG TABS tablet Take 5 mg by mouth daily. (Patient not taking: Reported on 05/18/2020)  . [DISCONTINUED] nitroGLYCERIN (NITROSTAT) 0.4 MG SL tablet Place 0.4 mg under the tongue every 5 (five) minutes as needed. For chest pain. (Patient not taking: Reported on 05/18/2020)   No facility-administered encounter medications on file as of 05/18/2020.   Past medical history  History of erosive reflux esophagitis complicated by esophageal stricture which was dilated in October 2019, April 2010 and March 2011.  Esophageal biopsy revealed typical changes of esophagitis but no Barrett's or eosinophilic esophagitis.  Type 2 diabetes mellitus Hypertension. Obesity. Normal screening colonoscopy in March 2011. Chronic bilateral foot pain. Removal of sesamoid bone from right foot in June 1994 and redo in June 1995 Removal of sesamoid bone or bones from left foot in June 1995.  Allergies  Allergies  Allergen Reactions  . Percocet [Oxycodone-Acetaminophen] Nausea And Vomiting   Family history  Mother was diagnosed with lung carcinoma at  age 60 and died 62 years later at age 38.  He has no information about his father. He does not have any siblings.  Social history  He is married.  He has grown up son and daughter in good health. He has never smoked cigarettes and does not drink alcohol.  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  Disability ended at  retirement age of 57.  Physical examination  Blood pressure 128/78, pulse 91, temperature 98.1 F (36.7 C), temperature source Oral, height 5\' 8"  (1.727 m), weight 257 lb 1.9 oz (116.6 kg). Patient is alert and in no acute distress. He is wearing a mask. Conjunctiva is pink. Sclera is nonicteric Oropharyngeal mucosa is normal. He has upper and lower dentures in place. No neck masses or thyromegaly noted. Cardiac exam with regular rhythm normal S1 and S2. No murmur or gallop noted. Lungs are clear to auscultation. Abdomen is full but soft and nontender with organomegaly or masses. No LE edema or clubbing noted.  Labs/studies Results: Hemoglobin A1c 8.0 on 01/05/2020. No other labs available for review.  Assessment:  #1.  Esophageal dysphagia.  Patient is 65 year old Caucasian male who has history of erosive reflux esophagitis complicated by esophageal stricture balloon dilated and 2009, 2010 and 2011 who now presented with dysphagia primarily to liquids and he also has odynophagia.  Surprisingly he is not having any difficulty with solids.  Although his presentation is somewhat unusual I suspect he has recurrence of stricture and and I suspect he has esophagitis even though he is not having heartburn.  He certainly could have esophageal motility disorder.  He does not have oropharyngeal dysphagia.  #2.  Patient is average risk for colorectal carcinoma.  Last colonoscopy was 11 years ago.  Patient is interested in proceeding with colonoscopy at the time of EGD.   Recommendations  Esophagogastroduodenoscopy with esophageal dilation followed by average her screening colonoscopy near future with propofol. Procedure risks reviewed the patient and he is agreeable.

## 2020-05-28 ENCOUNTER — Encounter (HOSPITAL_COMMUNITY): Payer: Self-pay

## 2020-05-31 ENCOUNTER — Other Ambulatory Visit: Payer: Self-pay

## 2020-05-31 ENCOUNTER — Other Ambulatory Visit (HOSPITAL_COMMUNITY)
Admission: RE | Admit: 2020-05-31 | Discharge: 2020-05-31 | Disposition: A | Discharge status: Home / Self Care | Payer: Medicare Other | Source: Ambulatory Visit | Attending: Internal Medicine | Admitting: Internal Medicine

## 2020-05-31 ENCOUNTER — Encounter (HOSPITAL_COMMUNITY)
Admission: RE | Admit: 2020-05-31 | Discharge: 2020-05-31 | Disposition: A | Discharge status: Home / Self Care | Payer: Medicare Other | Source: Ambulatory Visit | Attending: Internal Medicine | Admitting: Internal Medicine

## 2020-05-31 DIAGNOSIS — Z01812 Encounter for preprocedural laboratory examination: Secondary | ICD-10-CM | POA: Insufficient documentation

## 2020-05-31 DIAGNOSIS — Z20822 Contact with and (suspected) exposure to covid-19: Secondary | ICD-10-CM | POA: Diagnosis not present

## 2020-05-31 HISTORY — DX: Essential (primary) hypertension: I10

## 2020-05-31 HISTORY — DX: Basal cell carcinoma of skin of unspecified parts of face: C44.310

## 2020-05-31 HISTORY — DX: Type 2 diabetes mellitus without complications: E11.9

## 2020-05-31 LAB — SARS CORONAVIRUS 2 (TAT 6-24 HRS): SARS Coronavirus 2: NEGATIVE

## 2020-06-02 ENCOUNTER — Encounter (HOSPITAL_COMMUNITY)
Admission: RE | Disposition: A | Discharge status: Home / Self Care | Payer: Self-pay | Source: Home / Self Care | Attending: Internal Medicine

## 2020-06-02 ENCOUNTER — Ambulatory Visit (HOSPITAL_COMMUNITY): Payer: Medicare Other | Admitting: Certified Registered Nurse Anesthetist

## 2020-06-02 ENCOUNTER — Encounter (HOSPITAL_COMMUNITY): Payer: Self-pay | Admitting: Internal Medicine

## 2020-06-02 ENCOUNTER — Other Ambulatory Visit: Payer: Self-pay

## 2020-06-02 ENCOUNTER — Ambulatory Visit (HOSPITAL_COMMUNITY)
Admission: RE | Admit: 2020-06-02 | Discharge: 2020-06-02 | Disposition: A | Discharge status: Home / Self Care | Payer: Medicare Other | Attending: Internal Medicine | Admitting: Internal Medicine

## 2020-06-02 DIAGNOSIS — K648 Other hemorrhoids: Secondary | ICD-10-CM | POA: Insufficient documentation

## 2020-06-02 DIAGNOSIS — K2100 Gastro-esophageal reflux disease with esophagitis, without bleeding: Secondary | ICD-10-CM | POA: Diagnosis not present

## 2020-06-02 DIAGNOSIS — E669 Obesity, unspecified: Secondary | ICD-10-CM | POA: Insufficient documentation

## 2020-06-02 DIAGNOSIS — K922 Gastrointestinal hemorrhage, unspecified: Secondary | ICD-10-CM | POA: Diagnosis not present

## 2020-06-02 DIAGNOSIS — Z801 Family history of malignant neoplasm of trachea, bronchus and lung: Secondary | ICD-10-CM | POA: Diagnosis not present

## 2020-06-02 DIAGNOSIS — Z79899 Other long term (current) drug therapy: Secondary | ICD-10-CM | POA: Diagnosis not present

## 2020-06-02 DIAGNOSIS — K222 Esophageal obstruction: Secondary | ICD-10-CM | POA: Insufficient documentation

## 2020-06-02 DIAGNOSIS — Z7982 Long term (current) use of aspirin: Secondary | ICD-10-CM | POA: Insufficient documentation

## 2020-06-02 DIAGNOSIS — K449 Diaphragmatic hernia without obstruction or gangrene: Secondary | ICD-10-CM | POA: Diagnosis not present

## 2020-06-02 DIAGNOSIS — R1319 Other dysphagia: Secondary | ICD-10-CM

## 2020-06-02 DIAGNOSIS — Z885 Allergy status to narcotic agent status: Secondary | ICD-10-CM | POA: Insufficient documentation

## 2020-06-02 DIAGNOSIS — K2980 Duodenitis without bleeding: Secondary | ICD-10-CM

## 2020-06-02 DIAGNOSIS — I10 Essential (primary) hypertension: Secondary | ICD-10-CM | POA: Diagnosis not present

## 2020-06-02 DIAGNOSIS — K635 Polyp of colon: Secondary | ICD-10-CM | POA: Diagnosis not present

## 2020-06-02 DIAGNOSIS — R1314 Dysphagia, pharyngoesophageal phase: Secondary | ICD-10-CM | POA: Insufficient documentation

## 2020-06-02 DIAGNOSIS — Z1211 Encounter for screening for malignant neoplasm of colon: Secondary | ICD-10-CM | POA: Insufficient documentation

## 2020-06-02 DIAGNOSIS — Z7984 Long term (current) use of oral hypoglycemic drugs: Secondary | ICD-10-CM | POA: Insufficient documentation

## 2020-06-02 DIAGNOSIS — Z8719 Personal history of other diseases of the digestive system: Secondary | ICD-10-CM

## 2020-06-02 DIAGNOSIS — K644 Residual hemorrhoidal skin tags: Secondary | ICD-10-CM | POA: Diagnosis not present

## 2020-06-02 DIAGNOSIS — E119 Type 2 diabetes mellitus without complications: Secondary | ICD-10-CM | POA: Insufficient documentation

## 2020-06-02 DIAGNOSIS — D120 Benign neoplasm of cecum: Secondary | ICD-10-CM | POA: Insufficient documentation

## 2020-06-02 DIAGNOSIS — D122 Benign neoplasm of ascending colon: Secondary | ICD-10-CM | POA: Insufficient documentation

## 2020-06-02 DIAGNOSIS — Z6838 Body mass index (BMI) 38.0-38.9, adult: Secondary | ICD-10-CM | POA: Diagnosis not present

## 2020-06-02 DIAGNOSIS — D123 Benign neoplasm of transverse colon: Secondary | ICD-10-CM | POA: Diagnosis not present

## 2020-06-02 DIAGNOSIS — K3189 Other diseases of stomach and duodenum: Secondary | ICD-10-CM | POA: Diagnosis not present

## 2020-06-02 HISTORY — PX: POLYPECTOMY: SHX149

## 2020-06-02 HISTORY — PX: COLONOSCOPY WITH PROPOFOL: SHX5780

## 2020-06-02 HISTORY — PX: BIOPSY: SHX5522

## 2020-06-02 HISTORY — PX: ESOPHAGEAL DILATION: SHX303

## 2020-06-02 HISTORY — PX: ESOPHAGOGASTRODUODENOSCOPY (EGD) WITH PROPOFOL: SHX5813

## 2020-06-02 LAB — HM COLONOSCOPY

## 2020-06-02 LAB — GLUCOSE, CAPILLARY: Glucose-Capillary: 154 mg/dL — ABNORMAL HIGH (ref 70–99)

## 2020-06-02 SURGERY — COLONOSCOPY WITH PROPOFOL
Anesthesia: General

## 2020-06-02 MED ORDER — LACTATED RINGERS IV SOLN: INTRAVENOUS | Status: DC

## 2020-06-02 MED ORDER — PANTOPRAZOLE SODIUM 40 MG PO TBEC: 40.0000 mg | DELAYED_RELEASE_TABLET | Freq: Every day | ORAL | 3 refills | Status: DC

## 2020-06-02 MED ORDER — CHLORHEXIDINE GLUCONATE CLOTH 2 % EX PADS: 6.0000 | MEDICATED_PAD | Freq: Once | CUTANEOUS | Status: DC

## 2020-06-02 MED ORDER — LIDOCAINE HCL (CARDIAC) PF 100 MG/5ML IV SOSY
PREFILLED_SYRINGE | INTRAVENOUS | Status: DC | PRN
  Administered 2020-06-02: 50 mg via INTRAVENOUS

## 2020-06-02 MED ORDER — PROPOFOL 10 MG/ML IV BOLUS
INTRAVENOUS | Status: DC | PRN
  Administered 2020-06-02: 100 mg via INTRAVENOUS

## 2020-06-02 MED ORDER — FAMOTIDINE 20 MG PO TABS: 20.0000 mg | ORAL_TABLET | Freq: Every day | ORAL | Status: DC

## 2020-06-02 MED ORDER — PROPOFOL 500 MG/50ML IV EMUL
INTRAVENOUS | Status: DC | PRN
  Administered 2020-06-02: 150 ug/kg/min via INTRAVENOUS

## 2020-06-02 NOTE — Anesthesia Preprocedure Evaluation (Signed)
Anesthesia Evaluation  Patient identified by MRN, date of birth, ID band Patient awake    Reviewed: Allergy & Precautions, H&P , NPO status , Patient's Chart, lab work & pertinent test results, reviewed documented beta blocker date and time   Airway Mallampati: II  TM Distance: >3 FB Neck ROM: full    Dental no notable dental hx.    Pulmonary neg pulmonary ROS,    Pulmonary exam normal breath sounds clear to auscultation       Cardiovascular Exercise Tolerance: Good hypertension, negative cardio ROS   Rhythm:regular Rate:Normal     Neuro/Psych negative neurological ROS  negative psych ROS   GI/Hepatic negative GI ROS, Neg liver ROS,   Endo/Other  negative endocrine ROSdiabetes, Well Controlled, Type 2  Renal/GU negative Renal ROS  negative genitourinary   Musculoskeletal   Abdominal   Peds  Hematology negative hematology ROS (+)   Anesthesia Other Findings   Reproductive/Obstetrics negative OB ROS                             Anesthesia Physical Anesthesia Plan  ASA: II  Anesthesia Plan: General   Post-op Pain Management:    Induction:   PONV Risk Score and Plan: Propofol infusion  Airway Management Planned:   Additional Equipment:   Intra-op Plan:   Post-operative Plan:   Informed Consent: I have reviewed the patients History and Physical, chart, labs and discussed the procedure including the risks, benefits and alternatives for the proposed anesthesia with the patient or authorized representative who has indicated his/her understanding and acceptance.     Dental Advisory Given  Plan Discussed with: CRNA  Anesthesia Plan Comments:         Anesthesia Quick Evaluation

## 2020-06-02 NOTE — Anesthesia Postprocedure Evaluation (Signed)
Anesthesia Post Note  Patient: Dominic Pace  Procedure(s) Performed: COLONOSCOPY WITH PROPOFOL (N/A ) ESOPHAGOGASTRODUODENOSCOPY (EGD) WITH PROPOFOL (N/A ) ESOPHAGEAL DILATION (N/A ) BIOPSY POLYPECTOMY INTESTINAL  Patient location during evaluation: Phase II Anesthesia Type: General Level of consciousness: awake and alert Pain management: satisfactory to patient Vital Signs Assessment: post-procedure vital signs reviewed and stable Respiratory status: spontaneous breathing and respiratory function stable Cardiovascular status: blood pressure returned to baseline and stable Postop Assessment: no apparent nausea or vomiting Anesthetic complications: no   No complications documented.   Last Vitals:  Vitals:   06/02/20 1056 06/02/20 1233  BP: (!) 164/77 106/66  Pulse: 76 88  Resp: (!) 21 (!) 24  Temp: 36.7 C 36.5 C  SpO2: 97% 96%    Last Pain:  Vitals:   06/02/20 1233  TempSrc: Oral  PainSc:                  Karna Dupes

## 2020-06-02 NOTE — Op Note (Signed)
[facility] Patient Name: Dominic Pace Procedure Date: 06/02/2020 12:01 PM MRN: [id number] Date of Birth: Dec 05, 1955 Attending MD: Hildred Laser , MD CSN: [id number] Age: 65 Admit Type: Outpatient Procedure:                Colonoscopy Indications:              Screening for colorectal malignant neoplasm Providers:                Hildred Laser, MD, Gwenlyn Fudge, RN, Randa Spike, Technician Referring MD:             Collene Mares, PA Medicines:                Propofol per Anesthesia Complications:            No immediate complications. Estimated Blood Loss:     Estimated blood loss was minimal. Estimated blood                            loss was minimal. Procedure:                Pre-Anesthesia Assessment:                           - Prior to the procedure, a History and Physical                            was performed, and patient medications and                            allergies were reviewed. The patient's tolerance of                            previous anesthesia was also reviewed. The risks                            and benefits of the procedure and the sedation                            options and risks were discussed with the patient.                            All questions were answered, and informed consent                            was obtained. Prior Anticoagulants: The patient has                            taken no previous anticoagulant or antiplatelet                            agents except for aspirin. ASA Grade Assessment: II                            -  A patient with mild systemic disease. After                            reviewing the risks and benefits, the patient was                            deemed in satisfactory condition to undergo the                            procedure.                           After obtaining informed consent, the colonoscope                            was passed under direct vision.  Throughout the                            procedure, the patient's blood pressure, pulse, and                            oxygen saturations were monitored continuously. The                            [id number]-HQ190L ([id number]) scope was introduced through                            the anus and advanced to the the cecum, identified                            by appendiceal orifice and ileocecal valve. The                            colonoscopy was performed without difficulty. The                            patient tolerated the procedure well. The quality                            of the bowel preparation was good. Scope In: 12:02:49 PM Scope Out: 12:26:23 PM Scope Withdrawal Time: 0 hours 19 minutes 31 seconds  Total Procedure Duration: 0 hours 23 minutes 34 seconds  Findings:      The perianal and digital rectal examinations were normal.      Two sessile polyps were found in the cecum. The polyps were small in       size. These were biopsied with a cold forceps for histology. The       pathology specimen was placed into Bottle Number 2.      A 5 mm polyp was found in the ascending colon. The polyp was removed       with a cold snare. Resection and retrieval were complete. The pathology       specimen was placed into Bottle Number 2.      Three polyps were found in the transverse colon and hepatic flexure. The  polyps were 4 to 6 mm in size. These polyps were removed with a cold       snare. Resection and retrieval were complete. The pathology specimen was       placed into Bottle Number 2.      A 6 mm polyp was found in the splenic flexure. The polyp was removed       with a cold snare. Resection and retrieval were complete. The pathology       specimen was placed into Bottle Number 3.      Internal and external hemorrhoids were found during retroflexion. The       hemorrhoids were small. Impression:               - Two small polyps in the cecum. Biopsied.                            - One 5 mm polyp in the ascending colon, removed                            with a cold snare. Resected and retrieved.                           - Three 4 to 6 mm polyps in the transverse colon                            and at the hepatic flexure, removed with a cold                            snare. Resected and retrieved.                           - One 6 mm polyp at the splenic flexure, removed                            with a cold snare. Resected and retrieved.                           - Small internal and external hemorrhoids. Moderate Sedation:      Per Anesthesia Care Recommendation:           - Patient has a contact number available for                            emergencies. The signs and symptoms of potential                            delayed complications were discussed with the                            patient. Return to normal activities tomorrow.                            Written discharge instructions were provided to the  patient.                           - Resume previous diet today.                           - Continue present medications.                           - No aspirin, ibuprofen, naproxen, or other                            non-steroidal anti-inflammatory drugs for 2 days.                           - Await pathology results.                           - Repeat colonoscopy is recommended. The                            colonoscopy date will be determined after pathology                            results from today's exam become available for                            review. Procedure Code(s):        --- Professional ---                           [phone], Colonoscopy, flexible; with removal of                            tumor(s), polyp(s), or other lesion(s) by snare                            technique                           45380, 10, Colonoscopy, flexible; with biopsy,                            single or  multiple Diagnosis Code(s):        --- Professional ---                           K63.5, Polyp of colon                           Z12.11, Encounter for screening for malignant                            neoplasm of colon CPT copyright 2019 [employer]. All rights reserved. The codes documented in this report are preliminary and upon coder review may  be revised to meet current compliance requirements. Hildred Laser, MD Hildred Laser, MD 06/02/2020 12:47:05 PM This report  has been signed electronically. Number of Addenda: 0

## 2020-06-02 NOTE — Op Note (Signed)
[facility] Patient Name: Dominic Pace Procedure Date: 06/02/2020 11:20 AM MRN: [id number] Date of Birth: 1955-06-04 Attending MD: Hildred Laser , MD CSN: [id number] Age: 65 Admit Type: Outpatient Procedure:                Upper GI endoscopy Indications:              Esophageal dysphagia, For therapy of esophageal                            stenosis Providers:                Hildred Laser, MD, Gwenlyn Fudge, RN, Randa Spike, Technician Referring MD:             Collene Mares, PA Medicines:                Propofol per Anesthesia Complications:            No immediate complications. Estimated Blood Loss:     Estimated blood loss was minimal. Procedure:                Pre-Anesthesia Assessment:                           - Prior to the procedure, a History and Physical                            was performed, and patient medications and                            allergies were reviewed. The patient's tolerance of                            previous anesthesia was also reviewed. The risks                            and benefits of the procedure and the sedation                            options and risks were discussed with the patient.                            All questions were answered, and informed consent                            was obtained. Prior Anticoagulants: The patient has                            taken no previous anticoagulant or antiplatelet                            agents except for aspirin. ASA Grade Assessment: II                            -  A patient with mild systemic disease. After                            reviewing the risks and benefits, the patient was                            deemed in satisfactory condition to undergo the                            procedure.                           After obtaining informed consent, the endoscope was                            passed under direct vision. Throughout the                             procedure, the patient's blood pressure, pulse, and                            oxygen saturations were monitored continuously. The                            [id number]-[id number] ([id number]) scope was introduced through the                            mouth, and advanced to the second part of duodenum.                            The upper GI endoscopy was accomplished without                            difficulty. The patient tolerated the procedure                            well. Scope In: 11:48:57 AM Scope Out: 11:58:54 AM Total Procedure Duration: 0 hours 9 minutes 57 seconds  Findings:      The hypopharynx was normal.      The proximal esophagus and mid esophagus were normal.      LA Grade B (one or more mucosal breaks greater than 5 mm, not extending       between the tops of two mucosal folds) esophagitis with no bleeding was       found 31 to 36 cm from the incisors.      One benign-appearing, intrinsic mild stenosis was found 36 cm from the       incisors. The stenosis was traversed. A TTS dilator was passed through       the scope. Dilation with a 15-16.5-18 mm balloon dilator was performed       to 15 mm, 16.5 mm and 18 mm. The dilation site was examined and showed       no change and no bleeding, mucosal tear or perforation.      A 2 cm hiatal hernia was present.      A single  erosion with stigmata of recent bleeding was found in the       gastric antrum. Biopsies were taken with a cold forceps for histology.       The pathology specimen was placed into Bottle Number 1.      The exam of the stomach was otherwise normal.      Patchy mild inflammation characterized by congestion (edema), erosions       and erythema was found in the duodenal bulb.      The second portion of the duodenum was normal. Impression:               - Normal hypopharynx.                           - Normal proximal esophagus and mid esophagus.                           - LA Grade B reflux esophagitis  with no bleeding.                           - Benign-appearing esophageal stenosis. Dilated.                           - 2 cm hiatal hernia.                           - Erosive gastropathy with stigmata of recent                            bleeding. Biopsied.                           - Duodenitis.                           - Normal second portion of the duodenum. Moderate Sedation:      Per Anesthesia Care Recommendation:           - Patient has a contact number available for                            emergencies. The signs and symptoms of potential                            delayed complications were discussed with the                            patient. Return to normal activities tomorrow.                            Written discharge instructions were provided to the                            patient.                           - Resume previous diet today.                           -  Continue present medications.                           - Use Protonix (pantoprazole) 40 mg PO daily before                            breakfast..                           - Use Pepcid (famotidine) 20 mg PO daily qhs..                           - Await pathology results.                           - Repeat upper endoscopy in 3 months.                           - Return to GI office in 3 months. Procedure Code(s):        --- Professional ---                           [phone], Esophagogastroduodenoscopy, flexible,                            transoral; with transendoscopic balloon dilation of                            esophagus (less than 30 mm diameter) Diagnosis Code(s):        --- Professional ---                           K21.00, Gastro-esophageal reflux disease with                            esophagitis, without bleeding                           K22.2, Esophageal obstruction                           K44.9, Diaphragmatic hernia without obstruction or                            gangrene                            K92.2, Gastrointestinal hemorrhage, unspecified                           K29.80, Duodenitis without bleeding                           R13.14, Dysphagia, pharyngoesophageal phase CPT copyright 2019 [employer]. All rights reserved. The codes documented in this report are preliminary and upon coder review may  be revised to meet current compliance requirements. Hildred Laser, MD Hildred Laser, MD 06/02/2020 12:39:38 PM This report has  been signed electronically. Number of Addenda: 0

## 2020-06-02 NOTE — Discharge Instructions (Signed)
Resume aspirin on 06/04/2020. Resume usual medications as before. Pantoprazole 40 mg by mouth 30 minutes before breakfast daily.  Can take first dose today before lunch. Pepcid/famotidine OTC 20 mg by mouth daily at bedtime. Resume usual diet. No driving for 24 hours. Physician will call with biopsy results.   Upper Endoscopy, Adult, Care After This sheet gives you information about how to care for yourself after your procedure. Your health care provider may also give you more specific instructions. If you have problems or questions, contact your health care provider. What can I expect after the procedure? After the procedure, it is common to have:  A sore throat.  Mild stomach pain or discomfort.  Bloating.  Nausea. Follow these instructions at home:  Follow instructions from your health care provider about what to eat or drink after your procedure.  Return to your normal activities as told by your health care provider. Ask your health care provider what activities are safe for you.  Take over-the-counter and prescription medicines only as told by your health care provider.  If you were given a sedative during the procedure, it can affect you for several hours. Do not drive or operate machinery until your health care provider says that it is safe.  Keep all follow-up visits as told by your health care provider. This is important.   Contact a health care provider if you have:  A sore throat that lasts longer than one day.  Trouble swallowing. Get help right away if:  You vomit blood or your vomit looks like coffee grounds.  You have: ? A fever. ? Bloody, black, or tarry stools. ? A severe sore throat or you cannot swallow. ? Difficulty breathing. ? Severe pain in your chest or abdomen. Summary  After the procedure, it is common to have a sore throat, mild stomach discomfort, bloating, and nausea.  If you were given a sedative during the procedure, it can affect you  for several hours. Do not drive or operate machinery until your health care provider says that it is safe.  Follow instructions from your health care provider about what to eat or drink after your procedure.  Return to your normal activities as told by your health care provider. This information is not intended to replace advice given to you by your health care provider. Make sure you discuss any questions you have with your health care provider. Document Revised: 02/25/2019 Document Reviewed: 07/30/2017 Elsevier Patient Education  2021 [place].  https://www.asge.org/home/for-patients/patient-information/understanding-eso-dilation-updated">  Esophageal Dilatation Esophageal dilatation, also called esophageal dilation, is a procedure to widen or open a blocked or narrowed part of the esophagus. The esophagus is the part of the body that moves food and liquid from the mouth to the stomach. You may need this procedure if:  You have a buildup of scar tissue in your esophagus that makes it difficult, painful, or impossible to swallow. This can be caused by gastroesophageal reflux disease (GERD).  You have cancer of the esophagus.  There is a problem with how food moves through your esophagus. In some cases, you may need this procedure repeated at a later time to dilate the esophagus gradually. Tell a health care provider about:  Any allergies you have.  All medicines you are taking, including vitamins, herbs, eye drops, creams, and over-the-counter medicines.  Any problems you or family members have had with anesthetic medicines.  Any blood disorders you have.  Any surgeries you have had.  Any medical conditions you have.  Any antibiotic medicines you are required to take before dental procedures.  Whether you are pregnant or may be pregnant. What are the risks? Generally, this is a safe procedure. However, problems may occur, including:  Bleeding due to a tear in the lining  of the esophagus.  A hole, or perforation, in the esophagus. What happens before the procedure?  Ask your health care provider about: ? Changing or stopping your regular medicines. This is especially important if you are taking diabetes medicines or blood thinners. ? Taking medicines such as aspirin and ibuprofen. These medicines can thin your blood. Do not take these medicines unless your health care provider tells you to take them. ? Taking over-the-counter medicines, vitamins, herbs, and supplements.  Follow instructions from your health care provider about eating or drinking restrictions.  Plan to have a responsible adult take you home from the hospital or clinic.  Plan to have a responsible adult care for you for the time you are told after you leave the hospital or clinic. This is important. What happens during the procedure?  You may be given a medicine to help you relax (sedative).  A numbing medicine may be sprayed into the back of your throat, or you may gargle the medicine.  Your health care provider may perform the dilatation using various surgical instruments, such as: ? Simple dilators. This instrument is carefully placed in the esophagus to stretch it. ? Guided wire bougies. This involves using an endoscope to insert a wire into the esophagus. A dilator is passed over this wire to enlarge the esophagus. Then the wire is removed. ? Balloon dilators. An endoscope with a small balloon is inserted into the esophagus. The balloon is inflated to stretch the esophagus and open it up. The procedure may vary among health care providers and hospitals. What can I expect after the procedure?  Your blood pressure, heart rate, breathing rate, and blood oxygen level will be monitored until you leave the hospital or clinic.  Your throat may feel slightly sore and numb. This will get better over time.  You will not be allowed to eat or drink until your throat is no longer numb.  When  you are able to drink, urinate, and sit on the edge of the bed without nausea or dizziness, you may be able to return home. Follow these instructions at home:  Take over-the-counter and prescription medicines only as told by your health care provider.  If you were given a sedative during the procedure, it can affect you for several hours. Do not drive or operate machinery until your health care provider says that it is safe.  Plan to have a responsible adult care for you for the time you are told. This is important.  Follow instructions from your health care provider about any eating or drinking restrictions.  Do not use any products that contain nicotine or tobacco, such as cigarettes, e-cigarettes, and chewing tobacco. If you need help quitting, ask your health care provider.  Keep all follow-up visits. This is important. Contact a health care provider if:  You have a fever.  You have pain that is not relieved by medicine. Get help right away if:  You have chest pain.  You have trouble breathing.  You have trouble swallowing.  You vomit blood.  You have black, tarry, or bloody stools. These symptoms may represent a serious problem that is an emergency. Do not wait to see if the symptoms will go away. Get medical help  right away. Call your local emergency services (911 in the [place] Do not drive yourself to the hospital. Summary  Esophageal dilatation, also called esophageal dilation, is a procedure to widen or open a blocked or narrowed part of the esophagus.  Plan to have a responsible adult take you home from the hospital or clinic.  For this procedure, a numbing medicine may be sprayed into the back of your throat, or you may gargle the medicine.  Do not drive or operate machinery until your health care provider says that it is safe. This information is not intended to replace advice given to you by your health care provider. Make sure you discuss any questions you have  with your health care provider. Document Revised: 07/16/2019 Document Reviewed: 07/16/2019 Elsevier Patient Education  2021 [place].   Colonoscopy, Adult, Care After This sheet gives you information about how to care for yourself after your procedure. Your doctor may also give you more specific instructions. If you have problems or questions, call your doctor. What can I expect after the procedure? After the procedure, it is common to have:  A small amount of blood in your poop (stool) for 24 hours.  Some gas.  Mild cramping or bloating in your belly (abdomen). Follow these instructions at home: Eating and drinking  Drink enough fluid to keep your pee (urine) pale yellow.  Follow instructions from your doctor about what you cannot eat or drink.  Return to your normal diet as told by your doctor. Avoid heavy or fried foods that are hard to digest.   Activity  Rest as told by your doctor.  Do not sit for a long time without moving. Get up to take short walks every 1-2 hours. This is important. Ask for help if you feel weak or unsteady.  Return to your normal activities as told by your doctor. Ask your doctor what activities are safe for you. To help cramping and bloating:  Try walking around.  Put heat on your belly as told by your doctor. Use the heat source that your doctor recommends, such as a moist heat pack or a heating pad. ? Put a towel between your skin and the heat source. ? Leave the heat on for 20-30 minutes. ? Remove the heat if your skin turns bright red. This is very important if you are unable to feel pain, heat, or cold. You may have a greater risk of getting burned.   General instructions  If you were given a medicine to help you relax (sedative) during your procedure, it can affect you for many hours. Do not drive or use machinery until your doctor says that it is safe.  For the first 24 hours after the procedure: ? Do not sign important  documents. ? Do not drink alcohol. ? Do your daily activities more slowly than normal. ? Eat foods that are soft and easy to digest.  Take over-the-counter or prescription medicines only as told by your doctor.  Keep all follow-up visits as told by your doctor. This is important. Contact a doctor if:  You have blood in your poop 2-3 days after the procedure. Get help right away if:  You have more than a small amount of blood in your poop.  You see large clumps of tissue (blood clots) in your poop.  Your belly is swollen.  You feel like you may vomit (nauseous).  You vomit.  You have a fever.  You have belly pain that gets worse,  and medicine does not help your pain. Summary  After the procedure, it is common to have a small amount of blood in your poop. You may also have mild cramping and bloating in your belly.  If you were given a medicine to help you relax (sedative) during your procedure, it can affect you for many hours. Do not drive or use machinery until your doctor says that it is safe.  Get help right away if you have a lot of blood in your poop, feel like you may vomit, have a fever, or have more belly pain. This information is not intended to replace advice given to you by your health care provider. Make sure you discuss any questions you have with your health care provider. Document Revised: 01/03/2019 Document Reviewed: 09/23/2018 Elsevier Patient Education  [place].  Colon Polyps  Colon polyps are tissue growths inside the colon, which is part of the large intestine. They are one of the types of polyps that can grow in the body. A polyp may be a round bump or a mushroom-shaped growth. You could have one polyp or more than one. Most colon polyps are noncancerous (benign). However, some colon polyps can become cancerous over time. Finding and removing the polyps early can help prevent this. What are the causes? The exact cause of colon polyps is not  known. What increases the risk? The following factors may make you more likely to develop this condition:  Having a family history of colorectal cancer or colon polyps.  Being older than 67 years of age.  Being younger than 67 years of age and having a significant family history of colorectal cancer or colon polyps or a genetic condition that puts you at higher risk of getting colon polyps.  Having inflammatory bowel disease, such as ulcerative colitis or Crohn's disease.  Having certain conditions passed from parent to child (hereditary conditions), such as: ? Familial adenomatous polyposis (FAP). ? Lynch syndrome. ? Turcot syndrome. ? Peutz-Jeghers syndrome. ? MUTYH-associated polyposis (MAP).  Being overweight.  Certain lifestyle factors. These include smoking cigarettes, drinking too much alcohol, not getting enough exercise, and eating a diet that is high in fat and red meat and low in fiber.  Having had childhood cancer that was treated with radiation of the abdomen. What are the signs or symptoms? Many times, there are no symptoms. If you have symptoms, they may include:  Blood coming from the rectum during a bowel movement.  Blood in the stool (feces). The blood may be bright red or very dark in color.  Pain in the abdomen.  A change in bowel habits, such as constipation or diarrhea. How is this diagnosed? This condition is diagnosed with a colonoscopy. This is a procedure in which a lighted, flexible scope is inserted into the opening between the buttocks (anus) and then passed into the colon to examine the area. Polyps are sometimes found when a colonoscopy is done as part of routine cancer screening tests. How is this treated? This condition is treated by removing any polyps that are found. Most polyps can be removed during a colonoscopy. Those polyps will then be tested for cancer. Additional treatment may be needed depending on the results of testing. Follow these  instructions at home: Eating and drinking  Eat foods that are high in fiber, such as fruits, vegetables, and whole grains.  Eat foods that are high in calcium and vitamin D, such as milk, cheese, yogurt, eggs, liver, fish, and broccoli.  Limit foods that are high in fat, such as fried foods and desserts.  Limit the amount of red meat, precooked or cured meat, or other processed meat that you eat, such as hot dogs, sausages, bacon, or meat loaves.  Limit sugary drinks.   Lifestyle  Maintain a healthy weight, or lose weight if recommended by your health care provider.  Exercise every day or as told by your health care provider.  Do not use any products that contain nicotine or tobacco, such as cigarettes, e-cigarettes, and chewing tobacco. If you need help quitting, ask your health care provider.  Do not drink alcohol if: ? Your health care provider tells you not to drink. ? You are pregnant, may be pregnant, or are planning to become pregnant.  If you drink alcohol: ? Limit how much you use to:  0-1 drink a day for women.  0-2 drinks a day for men. ? Know how much alcohol is in your drink. In the [place] one drink equals one 12 oz bottle of beer (355 mL), one 5 oz glass of wine (148 mL), or one 1 oz glass of hard liquor (44 mL). General instructions  Take over-the-counter and prescription medicines only as told by your health care provider.  Keep all follow-up visits. This is important. This includes having regularly scheduled colonoscopies. Talk to your health care provider about when you need a colonoscopy. Contact a health care provider if:  You have new or worsening bleeding during a bowel movement.  You have new or increased blood in your stool.  You have a change in bowel habits.  You lose weight for no known reason. Summary  Colon polyps are tissue growths inside the colon, which is part of the large intestine. They are one type of polyp that can grow in the  body.  Most colon polyps are noncancerous (benign), but some can become cancerous over time.  This condition is diagnosed with a colonoscopy.  This condition is treated by removing any polyps that are found. Most polyps can be removed during a colonoscopy. This information is not intended to replace advice given to you by your health care provider. Make sure you discuss any questions you have with your health care provider. Document Revised: 06/18/2019 Document Reviewed: 06/18/2019 Elsevier Patient Education  2021 [employer].

## 2020-06-02 NOTE — Interval H&P Note (Signed)
Patient has no new complaints. He is been off aspirin for 2 days. He did not experience any difficulty with prep. His examination is unchanged. Yes History and Physical Interval Note:  06/02/2020 11:41 AM  Dominic Pace  has presented today for surgery, with the diagnosis of Screening Colonoscopy Esophageal Dysphagia.  The various methods of treatment have been discussed with the patient and family. After consideration of risks, benefits and other options for treatment, the patient has consented to  Procedure(s) with comments: COLONOSCOPY WITH PROPOFOL (N/A) - AM ESOPHAGOGASTRODUODENOSCOPY (EGD) WITH PROPOFOL (N/A) ESOPHAGEAL DILATION (N/A) as a surgical intervention.  The patient's history has been reviewed, patient examined, no change in status, stable for surgery.  I have reviewed the patient's chart and labs.  Questions were answered to the patient's satisfaction.     [employer]

## 2020-06-02 NOTE — Transfer of Care (Signed)
Immediate Anesthesia Transfer of Care Note  Patient: Dominic Pace  Procedure(s) Performed: COLONOSCOPY WITH PROPOFOL (N/A ) ESOPHAGOGASTRODUODENOSCOPY (EGD) WITH PROPOFOL (N/A ) ESOPHAGEAL DILATION (N/A ) BIOPSY POLYPECTOMY INTESTINAL  Patient Location: PACU  Anesthesia Type:General  Level of Consciousness: awake  Airway & Oxygen Therapy: Patient Spontanous Breathing  Post-op Assessment: Report given to RN and Post -op Vital signs reviewed and stable  Post vital signs: Reviewed and stable  Last Vitals:  Vitals Value Taken Time  BP 106/66 06/02/20 1233  Temp 36.5 C 06/02/20 1233  Pulse 88 06/02/20 1233  Resp 24 06/02/20 1233  SpO2 96 % 06/02/20 1233    Last Pain:  Vitals:   06/02/20 1233  TempSrc: Oral  PainSc:       Patients Stated Pain Goal: 6 (71/85/50 1586)  Complications: No complications documented.

## 2020-06-03 LAB — SURGICAL PATHOLOGY

## 2020-06-07 ENCOUNTER — Encounter (INDEPENDENT_AMBULATORY_CARE_PROVIDER_SITE_OTHER): Payer: Self-pay | Admitting: *Deleted

## 2020-06-09 ENCOUNTER — Encounter (HOSPITAL_COMMUNITY): Payer: Self-pay | Admitting: Internal Medicine

## 2020-06-09 DIAGNOSIS — Z1389 Encounter for screening for other disorder: Secondary | ICD-10-CM | POA: Diagnosis not present

## 2020-06-09 DIAGNOSIS — E118 Type 2 diabetes mellitus with unspecified complications: Secondary | ICD-10-CM | POA: Diagnosis not present

## 2020-06-09 DIAGNOSIS — I10 Essential (primary) hypertension: Secondary | ICD-10-CM | POA: Diagnosis not present

## 2020-06-09 DIAGNOSIS — M79604 Pain in right leg: Secondary | ICD-10-CM | POA: Diagnosis not present

## 2020-06-09 DIAGNOSIS — E1165 Type 2 diabetes mellitus with hyperglycemia: Secondary | ICD-10-CM | POA: Diagnosis not present

## 2020-06-09 DIAGNOSIS — L910 Hypertrophic scar: Secondary | ICD-10-CM | POA: Diagnosis not present

## 2020-06-09 DIAGNOSIS — Z6839 Body mass index (BMI) 39.0-39.9, adult: Secondary | ICD-10-CM | POA: Diagnosis not present

## 2020-06-10 ENCOUNTER — Encounter (INDEPENDENT_AMBULATORY_CARE_PROVIDER_SITE_OTHER): Payer: Self-pay

## 2020-06-17 DIAGNOSIS — L820 Inflamed seborrheic keratosis: Secondary | ICD-10-CM | POA: Diagnosis not present

## 2020-06-17 DIAGNOSIS — Z0000 Encounter for general adult medical examination without abnormal findings: Secondary | ICD-10-CM | POA: Diagnosis not present

## 2020-06-17 DIAGNOSIS — Z1331 Encounter for screening for depression: Secondary | ICD-10-CM | POA: Diagnosis not present

## 2020-06-17 DIAGNOSIS — L910 Hypertrophic scar: Secondary | ICD-10-CM | POA: Diagnosis not present

## 2020-06-17 DIAGNOSIS — Z6839 Body mass index (BMI) 39.0-39.9, adult: Secondary | ICD-10-CM | POA: Diagnosis not present

## 2020-07-21 DIAGNOSIS — H6122 Impacted cerumen, left ear: Secondary | ICD-10-CM | POA: Diagnosis not present

## 2020-07-21 DIAGNOSIS — Z6839 Body mass index (BMI) 39.0-39.9, adult: Secondary | ICD-10-CM | POA: Diagnosis not present

## 2020-09-21 ENCOUNTER — Encounter (INDEPENDENT_AMBULATORY_CARE_PROVIDER_SITE_OTHER): Payer: Self-pay | Admitting: Internal Medicine

## 2020-09-21 ENCOUNTER — Ambulatory Visit (INDEPENDENT_AMBULATORY_CARE_PROVIDER_SITE_OTHER): Payer: Medicare Other | Admitting: Internal Medicine

## 2020-09-21 ENCOUNTER — Other Ambulatory Visit: Payer: Self-pay

## 2020-09-21 VITALS — BP 167/71 | HR 83 | Temp 98.8°F | Ht 68.0 in | Wt 260.0 lb

## 2020-09-21 DIAGNOSIS — Z8719 Personal history of other diseases of the digestive system: Secondary | ICD-10-CM

## 2020-09-21 DIAGNOSIS — K222 Esophageal obstruction: Secondary | ICD-10-CM | POA: Insufficient documentation

## 2020-09-21 NOTE — Patient Instructions (Signed)
Continue pantoprazole as before Try famotidine on as-needed basis rather than daily at bedtime. Take famotidine if you eat late or if you eat food that you think might give you heartburn.   Notify if swallowing difficulty returns.

## 2020-09-21 NOTE — Progress Notes (Signed)
Presenting complaint;  Follow-up for GERD complicated by esophageal stricture.  Database and subjective:  Patient is 65 year old Caucasian male with chronic GERD who was last seen in March 2022 for solid food dysphagia and he was not having heartburn.  He underwent EGD revealing grade B esophagitis with stricture at GE junction which was dilated with a balloon to 18 mm. He also underwent screening colonoscopy with removal of 7 polyps.  6 of these polyps were tubular adenomas.  These polyps were small.  Patient states he is doing well.  He has not had even single episode of heartburn.  He is not having swallowing difficulty anymore.  He is a slow eater and chews his food well.  He also denies sore throat chronic cough and sore throat.  His appetite is good.  Bowels move daily.  Generally has 2-3 bowel movements which is normal for him. He says he has joint I&D posterior 3 times a week.  Current Medications: Outpatient Encounter Medications as of 09/21/2020  Medication Sig   aspirin 81 MG EC tablet Take 1 tablet (81 mg total) by mouth daily.   famotidine (PEPCID) 20 MG tablet Take 1 tablet (20 mg total) by mouth at bedtime.   glimepiride (AMARYL) 4 MG tablet Take 4 mg by mouth daily with breakfast.   lisinopril (PRINIVIL,ZESTRIL) 20 MG tablet Take 20 mg by mouth daily.   metFORMIN (GLUCOPHAGE) 500 MG tablet Take 1,000 mg by mouth 2 (two) times daily with a meal.   Misc Natural Products (OSTEO BI-FLEX ADV TRIPLE ST PO) Take by mouth. 2 po Q am.   pantoprazole (PROTONIX) 40 MG tablet Take 1 tablet (40 mg total) by mouth daily with breakfast.   No facility-administered encounter medications on file as of 09/21/2020.    Objective: Blood pressure (!) 167/71, pulse 83, temperature 98.8 F (37.1 C), temperature source Oral, height 5\' 8"  (1.727 m), weight 260 lb (117.9 kg). Patient is alert and in no acute distress. He is wearing a mask. Conjunctiva is pink. Sclera is nonicteric Oropharyngeal  mucosa is normal. Dentition in satisfactory condition. No neck masses or thyromegaly noted. Cardiac exam with regular rhythm normal S1 and S2. No murmur or gallop noted. Lungs are clear to auscultation. Abdomen is full but soft and nontender with organomegaly or masses. No LE edema or clubbing noted.  Assessment:  #1.  Erosive reflux esophagitis complicated by esophageal stricture.  He underwent EGD with dilation on 06/02/2020.  Heartburn is well controlled with therapy and he is not having swallowing difficulty either.  #2.  History of colonic adenomas.  Patient had 7 polyps removed in March 2022 six of these were tubular adenomas.  Next colonoscopy in March 2025.  Plan:  Continue antireflux measures and pantoprazole as before. Use famotidine on as-needed basis.  Can take it a few weeks late late or eat foods that tend to give you heartburn. Notify if swallowing difficulty returns. Office visit in 1 year.

## 2020-10-07 DIAGNOSIS — Z6841 Body Mass Index (BMI) 40.0 and over, adult: Secondary | ICD-10-CM | POA: Diagnosis not present

## 2020-10-07 DIAGNOSIS — E1165 Type 2 diabetes mellitus with hyperglycemia: Secondary | ICD-10-CM | POA: Diagnosis not present

## 2020-10-07 DIAGNOSIS — I10 Essential (primary) hypertension: Secondary | ICD-10-CM | POA: Diagnosis not present

## 2021-03-01 DIAGNOSIS — E1165 Type 2 diabetes mellitus with hyperglycemia: Secondary | ICD-10-CM | POA: Diagnosis not present

## 2021-03-01 DIAGNOSIS — Z6839 Body mass index (BMI) 39.0-39.9, adult: Secondary | ICD-10-CM | POA: Diagnosis not present

## 2021-03-22 DIAGNOSIS — Z6838 Body mass index (BMI) 38.0-38.9, adult: Secondary | ICD-10-CM | POA: Diagnosis not present

## 2021-03-22 DIAGNOSIS — E1140 Type 2 diabetes mellitus with diabetic neuropathy, unspecified: Secondary | ICD-10-CM | POA: Diagnosis not present

## 2021-03-29 ENCOUNTER — Other Ambulatory Visit (INDEPENDENT_AMBULATORY_CARE_PROVIDER_SITE_OTHER): Payer: Self-pay | Admitting: Internal Medicine

## 2021-05-16 DIAGNOSIS — E1140 Type 2 diabetes mellitus with diabetic neuropathy, unspecified: Secondary | ICD-10-CM | POA: Diagnosis not present

## 2021-05-16 DIAGNOSIS — E6609 Other obesity due to excess calories: Secondary | ICD-10-CM | POA: Diagnosis not present

## 2021-05-16 DIAGNOSIS — Z6839 Body mass index (BMI) 39.0-39.9, adult: Secondary | ICD-10-CM | POA: Diagnosis not present

## 2021-05-16 DIAGNOSIS — E1165 Type 2 diabetes mellitus with hyperglycemia: Secondary | ICD-10-CM | POA: Diagnosis not present

## 2021-09-15 DIAGNOSIS — Z0001 Encounter for general adult medical examination with abnormal findings: Secondary | ICD-10-CM | POA: Diagnosis not present

## 2021-09-15 DIAGNOSIS — E1140 Type 2 diabetes mellitus with diabetic neuropathy, unspecified: Secondary | ICD-10-CM | POA: Diagnosis not present

## 2021-09-15 DIAGNOSIS — I10 Essential (primary) hypertension: Secondary | ICD-10-CM | POA: Diagnosis not present

## 2021-09-15 DIAGNOSIS — Z1331 Encounter for screening for depression: Secondary | ICD-10-CM | POA: Diagnosis not present

## 2021-09-26 ENCOUNTER — Encounter (INDEPENDENT_AMBULATORY_CARE_PROVIDER_SITE_OTHER): Payer: Self-pay | Admitting: Gastroenterology

## 2021-09-26 ENCOUNTER — Ambulatory Visit (INDEPENDENT_AMBULATORY_CARE_PROVIDER_SITE_OTHER): Payer: Medicare Other | Admitting: Gastroenterology

## 2021-09-26 VITALS — BP 164/88 | HR 90 | Temp 97.9°F | Ht 68.0 in | Wt 264.9 lb

## 2021-09-26 DIAGNOSIS — K222 Esophageal obstruction: Secondary | ICD-10-CM | POA: Diagnosis not present

## 2021-09-26 DIAGNOSIS — Z6841 Body Mass Index (BMI) 40.0 and over, adult: Secondary | ICD-10-CM | POA: Diagnosis not present

## 2021-09-26 DIAGNOSIS — Z8719 Personal history of other diseases of the digestive system: Secondary | ICD-10-CM | POA: Diagnosis not present

## 2021-09-26 NOTE — Progress Notes (Signed)
Maylon Pace, M.D. Gastroenterology & Hepatology [facility] [address] [place], [place] [address]  Primary Care Physician: Dominic Pace [address] [place] [address]  I will communicate my assessment and recommendations to the referring MD via EMR.  Problems: GERD Erosive esophagitis  History of Present Illness: Dominic Pace is a 66 y.o. male with past medical history of GERD complicated by esophagitis, hypertension, type 2 diabetes, basal cell carcinoma, who presents for follow up of GERD and esophagitis.  The patient was last seen on 09/21/2020. At that time, the patient was advised to continue pantoprazole and to take famotidine as needed.  Patient reports that he has felt well and denies any complaints. He states that he has not presented any heartburn, chest pain, dysphaga or odynophagia. He takes pantoprazole 40 mg after he wakes up - waits at least 40 minutes to have breakfast. He also takes Pepcid at night.  The patient denies having any nausea, vomiting, fever, chills, hematochezia, melena, hematemesis, abdominal distention, abdominal pain, diarrhea, jaundice, pruritus or weight loss.  Last EGD: 06/02/20 - LA Grade B reflux esophagitis with no bleeding. - Benign-appearing esophageal stenosis. Dilated. - 2 cm hiatal hernia. - Erosive gastropathy with stigmata of recent bleeding. Biopsied. - Duodenitis. - Normal second portion of the duodenum.  Path: A. STOMACH, BIOPSY:  -  Benign gastric mucosa with focal reactive changes  -  No H. pylori, intestinal metaplasia or malignancy identified  Last Colonoscopy:06/02/20 - Two small polyps in the cecum. Biopsied. - One 5 mm polyp in the ascending colon, removed with a cold snare. Resected and retrieved. - Three 4 to 6 mm polyps in the transverse colon and at the hepatic flexure, removed with a cold snare. Resected and retrieved. - One 6 mm polyp  at the splenic flexure, removed with a cold snare. Resected and retrieved. - Small internal and external hemorrhoids.  Path: 6 were TA  Recommended repeat in 3 years  Past Medical History: Past Medical History:  Diagnosis Date   Basal cell carcinoma (BCC) of skin of face    Hypertension    Type 2 diabetes, diet controlled ([place])     Past Surgical History: Past Surgical History:  Procedure Laterality Date   BIOPSY  06/02/2020   Procedure: BIOPSY;  Surgeon: Rogene Houston, MD;  [facility];  Service: Endoscopy;;  gastric   COLONOSCOPY WITH PROPOFOL N/A 06/02/2020   Procedure: COLONOSCOPY WITH PROPOFOL;  Surgeon: Rogene Houston, MD;  [facility];  Service: Endoscopy;  Laterality: N/A;  AM   ESOPHAGEAL DILATION N/A 06/02/2020   Procedure: ESOPHAGEAL DILATION;  Surgeon: Rogene Houston, MD;  [facility];  Service: Endoscopy;  Laterality: N/A;   ESOPHAGOGASTRODUODENOSCOPY (EGD) WITH PROPOFOL N/A 06/02/2020   Procedure: ESOPHAGOGASTRODUODENOSCOPY (EGD) WITH PROPOFOL;  Surgeon: Rogene Houston, MD;  [facility];  Service: Endoscopy;  Laterality: N/A;   FOOT SURGERY Bilateral    LEFT HEART CATHETERIZATION WITH CORONARY ANGIOGRAM N/A 10/20/2011   Procedure: LEFT HEART CATHETERIZATION WITH CORONARY ANGIOGRAM;  Surgeon: Sanda Klein, MD;  Location: [facility] CATH LAB;  Service: Cardiovascular;  Laterality: N/A;   POLYPECTOMY  06/02/2020   Procedure: POLYPECTOMY INTESTINAL;  Surgeon: Rogene Houston, MD;  [facility];  Service: Endoscopy;;    Family History:History reviewed. No pertinent family history.  Social History: Social History   Tobacco Use  Smoking Status Never  Smokeless Tobacco Never  Social History   Substance and Sexual Activity  Alcohol Use Never   Social History   Substance and Sexual Activity  Drug Use Never    Allergies: Allergies  Allergen Reactions   Percocet [Oxycodone-Acetaminophen] Nausea And  Vomiting    Medications: Current Outpatient Medications  Medication Sig Dispense Refill   famotidine (PEPCID) 20 MG tablet Take 1 tablet (20 mg total) by mouth at bedtime.     glipiZIDE (GLUCOTROL XL) 10 MG 24 hr tablet Take 10 mg by mouth daily with breakfast.     lisinopril (PRINIVIL,ZESTRIL) 20 MG tablet Take 20 mg by mouth daily.     metFORMIN (GLUCOPHAGE) 500 MG tablet Take 1,000 mg by mouth 2 (two) times daily with a meal.     pantoprazole (PROTONIX) 40 MG tablet TAKE 1 TABLET ONCE DAILY WITH BREAKFAST. 90 tablet 0   pioglitazone (ACTOS) 15 MG tablet Take 15 mg by mouth daily.     No current facility-administered medications for this visit.    Review of Systems: GENERAL: negative for malaise, night sweats HEENT: No changes in hearing or vision, no nose bleeds or other nasal problems. NECK: Negative for lumps, goiter, pain and significant neck swelling RESPIRATORY: Negative for cough, wheezing CARDIOVASCULAR: Negative for chest pain, leg swelling, palpitations, orthopnea GI: SEE HPI MUSCULOSKELETAL: Negative for joint pain or swelling, back pain, and muscle pain. SKIN: Negative for lesions, rash PSYCH: Negative for sleep disturbance, mood disorder and recent psychosocial stressors. HEMATOLOGY Negative for prolonged bleeding, bruising easily, and swollen nodes. ENDOCRINE: Negative for cold or heat intolerance, polyuria, polydipsia and goiter. NEURO: negative for tremor, gait imbalance, syncope and seizures. The remainder of the review of systems is noncontributory.   Physical Exam: BP (!) 164/88 (BP Location: Left Arm, Patient Position: Sitting, Cuff Size: Large)   Pulse 90   Temp 97.9 F (36.6 C) (Oral)   Ht '5\' 8"'$  (1.727 m)   Wt 264 lb 14.4 oz (120.2 kg)   BMI 40.28 kg/m  GENERAL: The patient is AO x3, in no acute distress. Obese. HEENT: Head is normocephalic and atraumatic. EOMI are intact. Mouth is well hydrated and without lesions. NECK: Supple. No masses LUNGS:  Clear to auscultation. No presence of rhonchi/wheezing/rales. Adequate chest expansion HEART: RRR, normal s1 and s2. ABDOMEN: Soft, nontender, no guarding, no peritoneal signs, and nondistended. BS +. No masses. EXTREMITIES: Without any cyanosis, clubbing, rash, lesions or edema. NEUROLOGIC: AOx3, no focal motor deficit. SKIN: no jaundice, no rashes  Imaging/Labs: as above  I personally reviewed and interpreted the available labs, imaging and endoscopic files.  Impression and Plan: Dominic Pace is a 66 y.o. male with past medical history of GERD complicated by esophagitis, hypertension, type 2 diabetes, basal cell carcinoma, who presents for follow up of GERD and esophagitis.  The patient has presented significant improvement of his symptoms while taking a combination of once a day PPI and night anti-H2 medications.  He denies having any side effects with the medication and has not presented any red flag signs.  We will continue this combination for now.  I encouraged the patient to continue working on weight loss as this may decrease the likelihood of having reflux events.  - Continue pantoprazole 40 mg qday - Patient encouraged to lose weight as this may provide benefit for her specific complaint but also due to other comorbidities - Continue Pepcid at night - RTC 1 year  All questions were answered.      Harvel Quale, MD Gastroenterology  and Hepatology [facility]

## 2021-09-26 NOTE — Patient Instructions (Signed)
Continue pantoprazole 40 mg qday Patient encouraged to lose weight as this may provide benefit for her specific complaint but also due to other comorbidities Continue Pepcid as night

## 2021-11-30 DIAGNOSIS — D225 Melanocytic nevi of trunk: Secondary | ICD-10-CM | POA: Diagnosis not present

## 2021-11-30 DIAGNOSIS — L570 Actinic keratosis: Secondary | ICD-10-CM | POA: Diagnosis not present

## 2021-11-30 DIAGNOSIS — Z08 Encounter for follow-up examination after completed treatment for malignant neoplasm: Secondary | ICD-10-CM | POA: Diagnosis not present

## 2021-11-30 DIAGNOSIS — Z85828 Personal history of other malignant neoplasm of skin: Secondary | ICD-10-CM | POA: Diagnosis not present

## 2022-04-20 DIAGNOSIS — E1165 Type 2 diabetes mellitus with hyperglycemia: Secondary | ICD-10-CM | POA: Diagnosis not present

## 2022-04-20 DIAGNOSIS — E782 Mixed hyperlipidemia: Secondary | ICD-10-CM | POA: Diagnosis not present

## 2022-04-20 DIAGNOSIS — I10 Essential (primary) hypertension: Secondary | ICD-10-CM | POA: Diagnosis not present

## 2022-04-20 DIAGNOSIS — E1140 Type 2 diabetes mellitus with diabetic neuropathy, unspecified: Secondary | ICD-10-CM | POA: Diagnosis not present

## 2022-04-20 DIAGNOSIS — Z6841 Body Mass Index (BMI) 40.0 and over, adult: Secondary | ICD-10-CM | POA: Diagnosis not present

## 2022-05-01 DIAGNOSIS — M79671 Pain in right foot: Secondary | ICD-10-CM | POA: Diagnosis not present

## 2022-05-01 DIAGNOSIS — M79672 Pain in left foot: Secondary | ICD-10-CM | POA: Diagnosis not present

## 2022-05-16 DIAGNOSIS — M899 Disorder of bone, unspecified: Secondary | ICD-10-CM | POA: Diagnosis not present

## 2022-07-14 DIAGNOSIS — E782 Mixed hyperlipidemia: Secondary | ICD-10-CM | POA: Diagnosis not present

## 2022-07-14 DIAGNOSIS — I10 Essential (primary) hypertension: Secondary | ICD-10-CM | POA: Diagnosis not present

## 2022-07-14 DIAGNOSIS — E1165 Type 2 diabetes mellitus with hyperglycemia: Secondary | ICD-10-CM | POA: Diagnosis not present

## 2022-07-14 DIAGNOSIS — Z6841 Body Mass Index (BMI) 40.0 and over, adult: Secondary | ICD-10-CM | POA: Diagnosis not present

## 2022-07-14 DIAGNOSIS — E1140 Type 2 diabetes mellitus with diabetic neuropathy, unspecified: Secondary | ICD-10-CM | POA: Diagnosis not present

## 2022-07-14 DIAGNOSIS — E7849 Other hyperlipidemia: Secondary | ICD-10-CM | POA: Diagnosis not present

## 2022-08-02 ENCOUNTER — Encounter (INDEPENDENT_AMBULATORY_CARE_PROVIDER_SITE_OTHER): Payer: Self-pay | Admitting: Gastroenterology

## 2022-09-21 DIAGNOSIS — M4646 Discitis, unspecified, lumbar region: Secondary | ICD-10-CM | POA: Diagnosis not present

## 2022-09-21 DIAGNOSIS — E1140 Type 2 diabetes mellitus with diabetic neuropathy, unspecified: Secondary | ICD-10-CM | POA: Diagnosis not present

## 2022-09-21 DIAGNOSIS — I10 Essential (primary) hypertension: Secondary | ICD-10-CM | POA: Diagnosis not present

## 2022-09-21 DIAGNOSIS — Z0001 Encounter for general adult medical examination with abnormal findings: Secondary | ICD-10-CM | POA: Diagnosis not present

## 2022-09-21 DIAGNOSIS — Z1331 Encounter for screening for depression: Secondary | ICD-10-CM | POA: Diagnosis not present

## 2022-09-21 DIAGNOSIS — E782 Mixed hyperlipidemia: Secondary | ICD-10-CM | POA: Diagnosis not present

## 2022-09-21 DIAGNOSIS — Z6841 Body Mass Index (BMI) 40.0 and over, adult: Secondary | ICD-10-CM | POA: Diagnosis not present

## 2022-09-28 ENCOUNTER — Ambulatory Visit (INDEPENDENT_AMBULATORY_CARE_PROVIDER_SITE_OTHER): Payer: Medicare Other | Admitting: Gastroenterology

## 2022-12-04 ENCOUNTER — Encounter (INDEPENDENT_AMBULATORY_CARE_PROVIDER_SITE_OTHER): Payer: Self-pay | Admitting: Gastroenterology

## 2022-12-04 ENCOUNTER — Ambulatory Visit (INDEPENDENT_AMBULATORY_CARE_PROVIDER_SITE_OTHER): Payer: Medicare Other | Admitting: Gastroenterology

## 2022-12-04 VITALS — BP 162/91 | HR 83 | Temp 98.0°F | Ht 68.0 in | Wt 280.2 lb

## 2022-12-04 DIAGNOSIS — K219 Gastro-esophageal reflux disease without esophagitis: Secondary | ICD-10-CM | POA: Diagnosis not present

## 2022-12-04 DIAGNOSIS — Z86010 Personal history of colonic polyps: Secondary | ICD-10-CM

## 2022-12-04 DIAGNOSIS — Z8719 Personal history of other diseases of the digestive system: Secondary | ICD-10-CM

## 2022-12-04 NOTE — Progress Notes (Signed)
Referring Provider: Samuella Bruin Primary Care Physician:  Avis Epley, PA-C Primary GI Physician: Dr. Levon Hedger   Chief Complaint  Patient presents with   Gastroesophageal Reflux    Follow up on GERD. Takes pantoprazole and reports doing well. Patient states he wanted to see if he was due for egd. States he has not had any trouble swallowing since last procedure. Was not sure if he had ulcers that needed to be followed up on.    Colon Cancer Screening    Patient states he is due for colonoscopy. Last on 2022 and Dr.Rehman note states repeat in 3 years. Patient is saying he was told to come back early and is due now.    HPI:   Dominic Pace is a 67 y.o. male with past medical history of GERD complicated by esophagitis, hypertension, type 2 diabetes, basal cell carcinoma   Patient presenting today for follow up of GERD.   Last seen July 2023, at that time doing well on protonix 40mg  daily and pepcid at bedtime.   Recommended to continue protonix 40mg  daily, pepcid at bedtime  Present:  GERD doing well. Not taking pepcid anymore. Only taking protonix 40mg  daily and no breakthrough symptoms, no dysphagia or odynophagia. Denies abdominal pain, rectal bleeding or melena. No weight loss. Doing kombucha tea which keeps his bowels moving well. He has no GI complaints today. Has had difficult losing weight and is going to talk to his PCP about weight loss options at next follow up. He did inquire about being due for repeat colonoscopy at this time and if he needed to have repeat EGD. He also notes taking ibuprofen somewhat regularly for leg pain.    Last EGD: 06/02/20 - LA Grade B reflux esophagitis with no bleeding. - Benign-appearing esophageal stenosis. Dilated. - 2 cm hiatal hernia. - Erosive gastropathy with stigmata of recent bleeding. Biopsied. - Duodenitis. - Normal second portion of the duodenum.   Path: A. STOMACH, BIOPSY:  -  Benign gastric mucosa with focal  reactive changes  -  No H. pylori, intestinal metaplasia or malignancy identified   Last Colonoscopy:06/02/20 - Two small polyps in the cecum. Biopsied. - One 5 mm polyp in the ascending colon, removed with a cold snare. Resected and retrieved. - Three 4 to 6 mm polyps in the transverse colon and at the hepatic flexure, removed with a cold snare. Resected and retrieved. - One 6 mm polyp at the splenic flexure, removed with a cold snare. Resected and retrieved. - Small internal and external hemorrhoids.   Path: 6 were TA   Recommended repeat in 3 years   Past Medical History:  Diagnosis Date   Basal cell carcinoma (BCC) of skin of face    Hypertension    Type 2 diabetes, diet controlled [facility])     Past Surgical History:  Procedure Laterality Date   BIOPSY  06/02/2020   Procedure: BIOPSY;  Surgeon: Malissa Hippo, MD;  [facility];  Service: Endoscopy;;  gastric   COLONOSCOPY WITH PROPOFOL N/A 06/02/2020   Procedure: COLONOSCOPY WITH PROPOFOL;  Surgeon: Malissa Hippo, MD;  [facility];  Service: Endoscopy;  Laterality: N/A;  AM   ESOPHAGEAL DILATION N/A 06/02/2020   Procedure: ESOPHAGEAL DILATION;  Surgeon: Malissa Hippo, MD;  [facility];  Service: Endoscopy;  Laterality: N/A;   ESOPHAGOGASTRODUODENOSCOPY (EGD) WITH PROPOFOL N/A 06/02/2020   Procedure: ESOPHAGOGASTRODUODENOSCOPY (EGD) WITH PROPOFOL;  Surgeon: Malissa Hippo, MD;  [facility];  Service: Endoscopy;  Laterality: N/A;   FOOT SURGERY Bilateral    LEFT HEART CATHETERIZATION WITH CORONARY ANGIOGRAM N/A 10/20/2011   Procedure: LEFT HEART CATHETERIZATION WITH CORONARY ANGIOGRAM;  Surgeon: Thurmon Fair, MD;  [facility];  Service: Cardiovascular;  Laterality: N/A;   POLYPECTOMY  06/02/2020   Procedure: POLYPECTOMY INTESTINAL;  Surgeon: Malissa Hippo, MD;  [facility];  Service: Endoscopy;;    Current Outpatient Medications  Medication Sig  Dispense Refill   glipiZIDE (GLUCOTROL XL) 10 MG 24 hr tablet Take 10 mg by mouth daily with breakfast.     lisinopril (PRINIVIL,ZESTRIL) 20 MG tablet Take 20 mg by mouth daily.     metFORMIN (GLUCOPHAGE) 500 MG tablet Take 1,000 mg by mouth 2 (two) times daily with a meal.     pantoprazole (PROTONIX) 40 MG tablet TAKE 1 TABLET ONCE DAILY WITH BREAKFAST. 90 tablet 0   pioglitazone (ACTOS) 15 MG tablet Take 15 mg by mouth daily.     No current facility-administered medications for this visit.    Allergies as of 12/04/2022 - Review Complete 12/04/2022  Allergen Reaction Noted   Percocet [oxycodone-acetaminophen] Nausea And Vomiting 10/20/2011    No family history on file.  Social History   Socioeconomic History   Marital status: Married    Spouse name: Not on file   Number of children: Not on file   Years of education: Not on file   Highest education level: Not on file  Occupational History   Not on file  Tobacco Use   Smoking status: Never   Smokeless tobacco: Never  Vaping Use   Vaping status: Never Used  Substance and Sexual Activity   Alcohol use: Never   Drug use: Never   Sexual activity: Not on file  Other Topics Concern   Not on file  Social History Narrative   Not on file   Social Determinants of Health   Financial Resource Strain: Not on file  Food Insecurity: Not on file  Transportation Needs: Not on file  Physical Activity: Not on file  Stress: Not on file  Social Connections: Unknown (07/25/2021)   Received from [facility], [facility]   Social Network    Social Network: Not on file    Review of systems General: negative for malaise, night sweats, fever, chills, weight loss Neck: Negative for lumps, goiter, pain and significant neck swelling Resp: Negative for cough, wheezing, dyspnea at rest CV: Negative for chest pain, leg swelling, palpitations, orthopnea GI: denies melena, hematochezia, nausea, vomiting, diarrhea, constipation, dysphagia,  odyonophagia, early satiety or unintentional weight loss.  MSK: Negative for joint pain or swelling, back pain, and muscle pain. Derm: Negative for itching or rash Psych: Denies depression, anxiety, memory loss, confusion. No homicidal or suicidal ideation.  Heme: Negative for prolonged bleeding, bruising easily, and swollen nodes. Endocrine: Negative for cold or heat intolerance, polyuria, polydipsia and goiter. Neuro: negative for tremor, gait imbalance, syncope and seizures. The remainder of the review of systems is noncontributory.  Physical Exam: BP (!) 162/91   Pulse 83   Temp 98 F (36.7 C) (Oral)   Ht 5\' 8"  (1.727 m)   Wt 280 lb 3.2 oz (127.1 kg)   BMI 42.60 kg/m  General:   Alert and oriented. No distress noted. Pleasant and cooperative.  Head:  Normocephalic and atraumatic. Eyes:  Conjuctiva clear without scleral icterus. Mouth:  Oral mucosa pink and moist. Good dentition. No lesions. Heart: Normal  rate and rhythm, s1 and s2 heart sounds present.  Lungs: Clear lung sounds in all lobes. Respirations equal and unlabored. Abdomen:  +BS, soft, non-tender and non-distended. No rebound or guarding. No HSM or masses noted. Derm: No palmar erythema or jaundice Msk:  Symmetrical without gross deformities. Normal posture. Extremities:  Without edema. Neurologic:  Alert and  oriented x4 Psych:  Alert and cooperative. Normal mood and affect.  Invalid input(s): "6 MONTHS"   ASSESSMENT: QAMAR BRAGA is a 67 y.o. male presenting today for follow up of GERD  GERD: well managed on protonix 40mg  daily. No longer taking pepcid. Denies breakthrough symptoms, dysphagia, odynophagia. Will continue with protonix 40mg  daily. PCP is filling this currently. I discussed with patient and SO, given no UGI symptoms, no indication to repeat EGD at this time. Did encourage patient to be mindful of frequent/ongoing use of NSAIDs (advil, aleve, naproxen, goody powder, ibuprofen) as these can be very  hard on your GI tract, causing inflammation, ulcers and damage to the lining of your GI tract.   Last colonoscopy in march 2022, with 7 polyps, 6 TAs, recommended to repeat exam in 3 years. I discussed with patient that he is due for repeat colonoscopy in March 2025, we also discussed that repeat colonoscopy timing is based on family/personal history, amount, type and size of polyps, therefore the recommendation for him is 3 years. Patient verbalized understanding of this.    PLAN:  Repeat TCS March 2025 2. Continue with protonix 40mg  daily  3. Good reflux precautions  4. Be mindful of frequent/ongoing NSAID use  All questions were answered, patient verbalized understanding and is in agreement with plan as outlined above.   Follow Up: 1 year  Blaine Guiffre L. Jeanmarie Hubert, MSN, APRN, AGNP-C Adult-Gerontology Nurse Practitioner [facility]  I have reviewed the note and agree with the APP's assessment as described in this progress note  Katrinka Blazing, MD Gastroenterology and Hepatology [facility]

## 2022-12-04 NOTE — Patient Instructions (Signed)
Please continue with protonix 40mg  daily We will plan for repeat colonoscopy in march 2025 Please be mindful of ongoing NSAID use (advil, aleve, naproxen, goody powder, ibuprofen) as these can be very hard on your GI tract, causing inflammation, ulcers and damage to the lining of your GI tract.   Follow up 1 year  It was a pleasure to see you today. I want to create trusting relationships with patients and provide genuine, compassionate, and quality care. I truly value your feedback! please be on the lookout for a survey regarding your visit with me today. I appreciate your input about our visit and your time in completing this!    Tilda Samudio L. Jeanmarie Hubert, MSN, APRN, AGNP-C Adult-Gerontology Nurse Practitioner [facility] Gastroenterology at [facility]

## 2022-12-18 DIAGNOSIS — Z6841 Body Mass Index (BMI) 40.0 and over, adult: Secondary | ICD-10-CM | POA: Diagnosis not present

## 2022-12-18 DIAGNOSIS — I10 Essential (primary) hypertension: Secondary | ICD-10-CM | POA: Diagnosis not present

## 2022-12-18 DIAGNOSIS — E782 Mixed hyperlipidemia: Secondary | ICD-10-CM | POA: Diagnosis not present

## 2022-12-18 DIAGNOSIS — E1140 Type 2 diabetes mellitus with diabetic neuropathy, unspecified: Secondary | ICD-10-CM | POA: Diagnosis not present

## 2023-01-11 DIAGNOSIS — E782 Mixed hyperlipidemia: Secondary | ICD-10-CM | POA: Diagnosis not present

## 2023-01-11 DIAGNOSIS — E1140 Type 2 diabetes mellitus with diabetic neuropathy, unspecified: Secondary | ICD-10-CM | POA: Diagnosis not present

## 2023-01-11 DIAGNOSIS — Z5320 Procedure and treatment not carried out because of patient's decision for unspecified reasons: Secondary | ICD-10-CM | POA: Diagnosis not present

## 2023-01-11 DIAGNOSIS — I10 Essential (primary) hypertension: Secondary | ICD-10-CM | POA: Diagnosis not present

## 2023-03-05 DIAGNOSIS — H2513 Age-related nuclear cataract, bilateral: Secondary | ICD-10-CM | POA: Diagnosis not present

## 2023-03-05 DIAGNOSIS — H524 Presbyopia: Secondary | ICD-10-CM | POA: Diagnosis not present

## 2023-03-05 DIAGNOSIS — E119 Type 2 diabetes mellitus without complications: Secondary | ICD-10-CM | POA: Diagnosis not present

## 2023-03-05 DIAGNOSIS — Z7984 Long term (current) use of oral hypoglycemic drugs: Secondary | ICD-10-CM | POA: Diagnosis not present

## 2023-03-05 DIAGNOSIS — H5203 Hypermetropia, bilateral: Secondary | ICD-10-CM | POA: Diagnosis not present

## 2023-03-22 DIAGNOSIS — D485 Neoplasm of uncertain behavior of skin: Secondary | ICD-10-CM | POA: Diagnosis not present

## 2023-03-22 DIAGNOSIS — I10 Essential (primary) hypertension: Secondary | ICD-10-CM | POA: Diagnosis not present

## 2023-03-22 DIAGNOSIS — Z6841 Body Mass Index (BMI) 40.0 and over, adult: Secondary | ICD-10-CM | POA: Diagnosis not present

## 2023-03-22 DIAGNOSIS — Z5320 Procedure and treatment not carried out because of patient's decision for unspecified reasons: Secondary | ICD-10-CM | POA: Diagnosis not present

## 2023-03-22 DIAGNOSIS — M4646 Discitis, unspecified, lumbar region: Secondary | ICD-10-CM | POA: Diagnosis not present

## 2023-03-22 DIAGNOSIS — E1140 Type 2 diabetes mellitus with diabetic neuropathy, unspecified: Secondary | ICD-10-CM | POA: Diagnosis not present

## 2023-03-22 DIAGNOSIS — E782 Mixed hyperlipidemia: Secondary | ICD-10-CM | POA: Diagnosis not present

## 2023-04-09 DIAGNOSIS — C4441 Basal cell carcinoma of skin of scalp and neck: Secondary | ICD-10-CM | POA: Diagnosis not present

## 2023-04-09 DIAGNOSIS — L821 Other seborrheic keratosis: Secondary | ICD-10-CM | POA: Diagnosis not present

## 2023-04-09 DIAGNOSIS — D225 Melanocytic nevi of trunk: Secondary | ICD-10-CM | POA: Diagnosis not present

## 2023-04-13 DIAGNOSIS — E782 Mixed hyperlipidemia: Secondary | ICD-10-CM | POA: Diagnosis not present

## 2023-04-13 DIAGNOSIS — E1140 Type 2 diabetes mellitus with diabetic neuropathy, unspecified: Secondary | ICD-10-CM | POA: Diagnosis not present

## 2023-04-30 ENCOUNTER — Encounter (INDEPENDENT_AMBULATORY_CARE_PROVIDER_SITE_OTHER): Payer: Self-pay | Admitting: *Deleted

## 2023-05-11 DIAGNOSIS — E782 Mixed hyperlipidemia: Secondary | ICD-10-CM | POA: Diagnosis not present

## 2023-05-11 DIAGNOSIS — E1140 Type 2 diabetes mellitus with diabetic neuropathy, unspecified: Secondary | ICD-10-CM | POA: Diagnosis not present

## 2023-05-14 DIAGNOSIS — M5442 Lumbago with sciatica, left side: Secondary | ICD-10-CM | POA: Diagnosis not present

## 2023-05-14 DIAGNOSIS — E1142 Type 2 diabetes mellitus with diabetic polyneuropathy: Secondary | ICD-10-CM | POA: Diagnosis not present

## 2023-05-14 DIAGNOSIS — M25552 Pain in left hip: Secondary | ICD-10-CM | POA: Diagnosis not present

## 2023-05-14 DIAGNOSIS — G8929 Other chronic pain: Secondary | ICD-10-CM | POA: Diagnosis not present

## 2023-05-16 ENCOUNTER — Telehealth (INDEPENDENT_AMBULATORY_CARE_PROVIDER_SITE_OTHER): Payer: Self-pay | Admitting: Gastroenterology

## 2023-05-16 NOTE — Telephone Encounter (Signed)
Room 3 Thanks 

## 2023-05-16 NOTE — Telephone Encounter (Signed)
 Who is your primary care physician: Durwin Glaze  Reasons for the colonoscopy:   Have you had a colonoscopy before?  Yes 2020  Do you have family history of colon cancer? no  Previous colonoscopy with polyps removed? Yes 2020  Do you have a history colorectal cancer?   no  Are you diabetic? If yes, Type 1 or Type 2?    Yes Type 2  Do you have a prosthetic or mechanical heart valve? no  Do you have a pacemaker/defibrillator?   no  Have you had endocarditis/atrial fibrillation? no  Have you had joint replacement within the last 12 months?  no  Do you tend to be constipated or have to use laxatives? no  Do you have any history of drugs or alchohol?  no  Do you use supplemental oxygen?  no  Have you had a stroke or heart attack within the last 6 months? no  Do you take weight loss medication?  no  Do you take any blood-thinning medications such as: (aspirin, warfarin, Plavix, Aggrenox)  no  If yes we need the name, milligram, dosage and who is prescribing doctor  Current Outpatient Medications on File Prior to Visit  Medication Sig Dispense Refill   Dapagliflozin Propanediol (FARXIGA PO) Take by mouth.     lisinopril (PRINIVIL,ZESTRIL) 20 MG tablet Take 20 mg by mouth daily.     metFORMIN (GLUCOPHAGE) 500 MG tablet Take 1,000 mg by mouth 2 (two) times daily with a meal.     pantoprazole (PROTONIX) 40 MG tablet TAKE 1 TABLET ONCE DAILY WITH BREAKFAST. 90 tablet 0   glipiZIDE (GLUCOTROL XL) 10 MG 24 hr tablet Take 10 mg by mouth daily with breakfast. (Patient not taking: Reported on 05/16/2023)     pioglitazone (ACTOS) 15 MG tablet Take 15 mg by mouth daily. (Patient not taking: Reported on 05/16/2023)     No current facility-administered medications on file prior to visit.    Allergies  Allergen Reactions   Percocet [Oxycodone-Acetaminophen] Nausea And Vomiting     Pharmacy: [facility]  Primary Insurance Name: [facility]  Best number where you can be  reached: [phone]

## 2023-05-17 DIAGNOSIS — M5442 Lumbago with sciatica, left side: Secondary | ICD-10-CM | POA: Diagnosis not present

## 2023-05-17 DIAGNOSIS — G8929 Other chronic pain: Secondary | ICD-10-CM | POA: Diagnosis not present

## 2023-05-17 DIAGNOSIS — M47816 Spondylosis without myelopathy or radiculopathy, lumbar region: Secondary | ICD-10-CM | POA: Diagnosis not present

## 2023-05-17 DIAGNOSIS — M5136 Other intervertebral disc degeneration, lumbar region with discogenic back pain only: Secondary | ICD-10-CM | POA: Diagnosis not present

## 2023-05-17 DIAGNOSIS — M48061 Spinal stenosis, lumbar region without neurogenic claudication: Secondary | ICD-10-CM | POA: Diagnosis not present

## 2023-05-24 NOTE — Telephone Encounter (Signed)
 Left message to return call

## 2023-05-25 MED ORDER — PEG 3350-KCL-NA BICARB-NACL 420 G PO SOLR: 4000.0000 mL | Freq: Once | ORAL | 0 refills | Status: AC

## 2023-05-25 NOTE — Telephone Encounter (Signed)
 Questionnaire from recall, no referral needed

## 2023-05-25 NOTE — Telephone Encounter (Signed)
Pt left message returning call  Returned call to pt but had to leave message to return call

## 2023-05-25 NOTE — Telephone Encounter (Signed)
 Pt left voicemail returning call. Returned call to pt. Pt scheduled. Instructions will be mailed once pre op is received. Prep sent to pharmacy. No pa needed per insurance.

## 2023-05-25 NOTE — Addendum Note (Signed)
 Addended by: Marlowe Shores on: 05/25/2023 09:04 AM   Modules accepted: Orders

## 2023-06-06 NOTE — Patient Instructions (Signed)
 Dominic Pace  06/06/2023     @PREFPERIOPPHARMACY @   Your procedure is scheduled on 06/12/2023.   Report to [facility] at 7:00 A.M.   Call this number if you have problems the morning of surgery:  [phone]  If you experience any cold or flu symptoms such as cough, fever, chills, shortness of breath, etc. between now and your scheduled surgery, please notify us at the above number.   Remember:   Please follow the diet and prep instructions given to you by Dr  Wilburt Finlay office.    You may drink clear liquids until 7:00 am the morning of the procedure .  Clear liquids allowed are:                    Water, Juice (No red color; non-citric and without pulp; diabetics please choose diet or no sugar options), Carbonated beverages (diabetics please choose diet or no sugar options), Clear Tea (No creamer, milk, or cream, including half & half and powdered creamer), Black Coffee Only (No creamer, milk or cream, including half & half and powdered creamer), Plain Jell-O Only (No red color; diabetics please choose no sugar options), Clear Sports drink (No red color; diabetics please choose diet or no sugar options), and Plain Popsicles Only (No red color; diabetics please choose no sugar options)    Take these medicines the morning of surgery with A SIP OF WATER : Protonix   Hold the Ozempic for 7 days.  The last dose should be on 06/04/23 or before.    Take 1/2 of your Metformin dose the night before the procedure.   Do not take any diabetic medications the am of the procedure.     Do not wear jewelry, make-up or nail polish, including gel polish,  artificial nails, or any other type of covering on natural nails (fingers and  toes).  Do not wear lotions, powders, or perfumes, or deodorant.  Do not shave 48 hours prior to surgery.  Men may shave face and neck.  Do not bring valuables to the hospital.  [facility] is not responsible for any belongings or valuables.  Contacts, dentures or  bridgework may not be worn into surgery.  Leave your suitcase in the car.  After surgery it may be brought to your room.  For patients admitted to the hospital, discharge time will be determined by your treatment team.  Patients discharged the day of surgery will not be allowed to drive home.   Name and phone number of your driver:   Family  Special instructions:  N/A  Please read over the following fact sheets that you were given. Care and Recovery After Surgery  Colonoscopy, Adult A colonoscopy is a procedure to look at the entire large intestine. This procedure is done using a long, thin, flexible tube that has a camera on the end. You may have a colonoscopy: As a part of normal colorectal screening. If you have certain symptoms, such as: A low number of red blood cells in your blood (anemia). Diarrhea that does not go away. Pain in your abdomen. Blood in your stool. A colonoscopy can help screen for and diagnose medical problems, including: An abnormal growth of cells or tissue (tumor). Abnormal growths within the lining of your intestine (polyps). Inflammation. Areas of bleeding. Tell your health care provider about: Any allergies you have. All medicines you are taking, including vitamins, herbs, eye drops, creams, and over-the-counter medicines. Any problems you or family members  have had with anesthetic medicines. Any bleeding problems you have. Any surgeries you have had. Any medical conditions you have. Any problems you have had with having bowel movements. Whether you are pregnant or may be pregnant. What are the risks? Generally, this is a safe procedure. However, problems may occur, including: Bleeding. Damage to your intestine. Allergic reactions to medicines given during the procedure. Infection. This is rare. What happens before the procedure? Eating and drinking restrictions Follow instructions from your health care provider about eating or drinking  restrictions, which may include: A few days before the procedure: Follow a low-fiber diet. Avoid nuts, seeds, dried fruit, raw fruits, and vegetables. 1-3 days before the procedure: Eat only gelatin dessert or ice pops. Drink only clear liquids, such as water, clear juice, clear broth or bouillon, black coffee or tea, or clear soft drinks or sports drinks. Avoid liquids that contain red or purple dye. The day of the procedure: Do not eat solid foods. You may continue to drink clear liquids until up to 2 hours before the procedure. Do not eat or drink anything starting 2 hours before the procedure, or within the time period that your health care provider recommends. Bowel prep If you were prescribed a bowel prep to take by mouth (orally) to clean out your colon: Take it as told by your health care provider. Starting the day before your procedure, you will need to drink a large amount of liquid medicine. The liquid will cause you to have many bowel movements of loose stool until your stool becomes almost clear or light green. If your skin or the opening between the buttocks (anus) gets irritated from diarrhea, you may relieve the irritation using: Wipes with medicine in them, such as adult wet wipes with aloe and vitamin E. A product to soothe skin, such as petroleum jelly. If you vomit while drinking the bowel prep: Take a break for up to 60 minutes. Begin the bowel prep again. Call your health care provider if you keep vomiting or you cannot take the bowel prep without vomiting. To clean out your colon, you may also be given: Laxative medicines. These help you have a bowel movement. Instructions for enema use. An enema is liquid medicine injected into your rectum. Medicines Ask your health care provider about: Changing or stopping your regular medicines or supplements. This is especially important if you are taking iron supplements, diabetes medicines, or blood thinners. Taking medicines  such as aspirin and ibuprofen. These medicines can thin your blood. Do not take these medicines unless your health care provider tells you to take them. Taking over-the-counter medicines, vitamins, herbs, and supplements. General instructions Ask your health care provider what steps will be taken to help prevent infection. These may include washing skin with a germ-killing soap. If you will be going home right after the procedure, plan to have a responsible adult: Take you home from the hospital or clinic. You will not be allowed to drive. Care for you for the time you are told. What happens during the procedure?  An IV will be inserted into one of your veins. You will be given a medicine to make you fall asleep (general anesthetic). You will lie on your side with your knees bent. A lubricant will be put on the tube. Then the tube will be: Inserted into your anus. Gently eased through all parts of your large intestine. Air will be sent into your colon to keep it open. This may cause some pressure or  cramping. Images will be taken with the camera and will appear on a screen. A small tissue sample may be removed to be looked at under a microscope (biopsy). The tissue may be sent to a lab for testing if any signs of problems are found. If small polyps are found, they may be removed and checked for cancer cells. When the procedure is finished, the tube will be removed. The procedure may vary among health care providers and hospitals. What happens after the procedure? Your blood pressure, heart rate, breathing rate, and blood oxygen level will be monitored until you leave the hospital or clinic. You may have a small amount of blood in your stool. You may pass gas and have mild cramping or bloating in your abdomen. This is caused by the air that was used to open your colon during the exam. If you were given a sedative during the procedure, it can affect you for several hours. Do not drive or  operate machinery until your health care provider says that it is safe. It is up to you to get the results of your procedure. Ask your health care provider, or the department that is doing the procedure, when your results will be ready. Summary A colonoscopy is a procedure to look at the entire large intestine. Follow instructions from your health care provider about eating and drinking before the procedure. If you were prescribed an oral bowel prep to clean out your colon, take it as told by your health care provider. During the colonoscopy, a flexible tube with a camera on its end is inserted into the anus and then passed into all parts of the large intestine. This information is not intended to replace advice given to you by your health care provider. Make sure you discuss any questions you have with your health care provider. Document Revised: 04/11/2022 Document Reviewed: 10/20/2020 Elsevier Patient Education  2024 [employer] Inc[employer]  Monitored Anesthesia Care Anesthesia refers to the techniques, procedures, and medicines that help a person stay safe and comfortable during surgery. Monitored anesthesia care, or sedation, is one type of anesthesia. You may have sedation if you do not need to be asleep for your procedure. Procedures that use sedation may include: Surgery to remove cataracts from your eyes. A dental procedure. A biopsy. This is when a tissue sample is removed and looked at under a microscope. You will be watched closely during your procedure. Your level of sedation or type of anesthesia may be changed to fit your needs. Tell a health care provider about: Any allergies you have. All medicines you are taking, including vitamins, herbs, eye drops, creams, and over-the-counter medicines. Any problems you or family members have had with anesthesia. Any bleeding problems you have. Any surgeries you have had. Any medical conditions or illnesses you have. This includes sleep apnea,  cough, fever, or the flu. Whether you are pregnant or may be pregnant. Whether you use cigarettes, alcohol, or drugs. Any use of steroids, whether by mouth or as a cream. What are the risks? Your health care provider will talk with you about risks. These may include: Getting too much medicine (oversedation). Nausea. Allergic reactions to medicines. Trouble breathing. If this happens, a breathing tube may be used to help you breathe. It will be removed when you are awake and breathing on your own. Heart trouble. Lung trouble. Confusion that gets better with time (emergence delirium). What happens before the procedure? When to stop eating and drinking Follow instructions from your health  care provider about what you may eat and drink. These may include: 8 hours before your procedure Stop eating most foods. Do not eat meat, fried foods, or fatty foods. Eat only light foods, such as toast or crackers. All liquids are okay except energy drinks and alcohol. 6 hours before your procedure Stop eating. Drink only clear liquids, such as water, clear fruit juice, black coffee, plain tea, and sports drinks. Do not drink energy drinks or alcohol. 2 hours before your procedure Stop drinking all liquids. You may be allowed to take medicines with small sips of water. If you do not follow your health care provider's instructions, your procedure may be delayed or canceled. Medicines Ask your health care provider about: Changing or stopping your regular medicines. These include any diabetes medicines or blood thinners you take. Taking medicines such as aspirin and ibuprofen. These medicines can thin your blood. Do not take them unless your health care provider tells you to. Taking over-the-counter medicines, vitamins, herbs, and supplements. Testing You may have an exam or testing. You may have a blood or urine sample taken. General instructions Do not use any products that contain nicotine or  tobacco for at least 4 weeks before the procedure. These products include cigarettes, chewing tobacco, and vaping devices, such as e-cigarettes. If you need help quitting, ask your health care provider. If you will be going home right after the procedure, plan to have a responsible adult: Take you home from the hospital or clinic. You will not be allowed to drive. Care for you for the time you are told. What happens during the procedure?  Your blood pressure, heart rate, breathing, level of pain, and blood oxygen level will be monitored. An IV will be inserted into one of your veins. You may be given: A sedative. This helps you relax. Anesthesia. This will: Numb certain areas of your body. Make you fall asleep for surgery. You will be given medicines as needed to keep you comfortable. The more medicine you are given, the deeper your level of sedation will be. Your level of sedation may be changed to fit your needs. There are three levels of sedation: Mild sedation. At this level, you may feel awake and relaxed. You will be able to follow directions. Moderate sedation. At this level, you will be sleepy. You may not remember the procedure. Deep sedation. At this level, you will be asleep. You will not remember the procedure. How you get the medicines will depend on your age and the procedure. They may be given as: A pill. This may be taken by mouth (orally) or inserted into the rectum. An injection. This may be into a vein or muscle. A spray through the nose. After your procedure is over, the medicine will be stopped. The procedure may vary among health care providers and hospitals. What happens after the procedure? Your blood pressure, heart rate, breathing rate, and blood oxygen level will be monitored until you leave the hospital or clinic. You may feel sleepy, clumsy, or nauseous. You may not remember what happened during or after the procedure. Sedation can affect you for several hours.  Do not drive or use machinery until your health care provider says that it is safe. This information is not intended to replace advice given to you by your health care provider. Make sure you discuss any questions you have with your health care provider. Document Revised: 07/25/2021 Document Reviewed: 07/25/2021 Elsevier Patient Education  2024 [employer].

## 2023-06-08 ENCOUNTER — Encounter (HOSPITAL_COMMUNITY)
Admission: RE | Admit: 2023-06-08 | Discharge: 2023-06-08 | Disposition: A | Discharge status: Home / Self Care | Source: Ambulatory Visit | Attending: Gastroenterology | Admitting: Gastroenterology

## 2023-06-08 DIAGNOSIS — E1100 Type 2 diabetes mellitus with hyperosmolarity without nonketotic hyperglycemic-hyperosmolar coma (NKHHC): Secondary | ICD-10-CM

## 2023-06-08 DIAGNOSIS — I10 Essential (primary) hypertension: Secondary | ICD-10-CM

## 2023-06-11 ENCOUNTER — Encounter (HOSPITAL_COMMUNITY): Payer: Self-pay

## 2023-06-11 ENCOUNTER — Encounter (HOSPITAL_COMMUNITY)
Admission: RE | Admit: 2023-06-11 | Discharge: 2023-06-11 | Disposition: A | Discharge status: Home / Self Care | Source: Ambulatory Visit | Attending: Gastroenterology | Admitting: Gastroenterology

## 2023-06-11 DIAGNOSIS — E1100 Type 2 diabetes mellitus with hyperosmolarity without nonketotic hyperglycemic-hyperosmolar coma (NKHHC): Secondary | ICD-10-CM | POA: Diagnosis not present

## 2023-06-11 DIAGNOSIS — Z01818 Encounter for other preprocedural examination: Secondary | ICD-10-CM | POA: Insufficient documentation

## 2023-06-11 DIAGNOSIS — I10 Essential (primary) hypertension: Secondary | ICD-10-CM | POA: Insufficient documentation

## 2023-06-11 DIAGNOSIS — E1140 Type 2 diabetes mellitus with diabetic neuropathy, unspecified: Secondary | ICD-10-CM | POA: Diagnosis not present

## 2023-06-11 DIAGNOSIS — Z5320 Procedure and treatment not carried out because of patient's decision for unspecified reasons: Secondary | ICD-10-CM | POA: Diagnosis not present

## 2023-06-11 DIAGNOSIS — E782 Mixed hyperlipidemia: Secondary | ICD-10-CM | POA: Diagnosis not present

## 2023-06-11 DIAGNOSIS — M5416 Radiculopathy, lumbar region: Secondary | ICD-10-CM | POA: Diagnosis not present

## 2023-06-11 LAB — BASIC METABOLIC PANEL WITH GFR
Anion gap: 11 (ref 5–15)
BUN: 20 mg/dL (ref 8–23)
CO2: 25 mmol/L (ref 22–32)
Calcium: 9.4 mg/dL (ref 8.9–10.3)
Chloride: 98 mmol/L (ref 98–111)
Creatinine, Ser: 1.23 mg/dL (ref 0.61–1.24)
GFR, Estimated: >60 mL/min (ref >60)
Glucose, Bld: 152 mg/dL — ABNORMAL HIGH (ref 70–99)
Potassium: 4.5 mmol/L (ref 3.5–5.1)
Sodium: 134 mmol/L — ABNORMAL LOW (ref 135–145)

## 2023-06-12 ENCOUNTER — Other Ambulatory Visit: Payer: Self-pay

## 2023-06-12 ENCOUNTER — Ambulatory Visit (HOSPITAL_COMMUNITY): Admitting: Anesthesiology

## 2023-06-12 ENCOUNTER — Encounter (HOSPITAL_COMMUNITY)
Admission: RE | Disposition: A | Discharge status: Home / Self Care | Payer: Self-pay | Source: Home / Self Care | Attending: Gastroenterology

## 2023-06-12 ENCOUNTER — Ambulatory Visit (HOSPITAL_BASED_OUTPATIENT_CLINIC_OR_DEPARTMENT_OTHER): Admitting: Anesthesiology

## 2023-06-12 ENCOUNTER — Encounter (HOSPITAL_COMMUNITY): Payer: Self-pay | Admitting: Gastroenterology

## 2023-06-12 ENCOUNTER — Ambulatory Visit (HOSPITAL_COMMUNITY)
Admission: RE | Admit: 2023-06-12 | Discharge: 2023-06-12 | Disposition: A | Discharge status: Home / Self Care | Attending: Gastroenterology | Admitting: Gastroenterology

## 2023-06-12 DIAGNOSIS — Z860100 Personal history of colon polyps, unspecified: Secondary | ICD-10-CM

## 2023-06-12 DIAGNOSIS — Z1211 Encounter for screening for malignant neoplasm of colon: Secondary | ICD-10-CM

## 2023-06-12 DIAGNOSIS — D124 Benign neoplasm of descending colon: Secondary | ICD-10-CM | POA: Insufficient documentation

## 2023-06-12 DIAGNOSIS — D120 Benign neoplasm of cecum: Secondary | ICD-10-CM | POA: Insufficient documentation

## 2023-06-12 DIAGNOSIS — D123 Benign neoplasm of transverse colon: Secondary | ICD-10-CM

## 2023-06-12 DIAGNOSIS — Z7984 Long term (current) use of oral hypoglycemic drugs: Secondary | ICD-10-CM | POA: Insufficient documentation

## 2023-06-12 DIAGNOSIS — E66813 Obesity, class 3: Secondary | ICD-10-CM | POA: Diagnosis not present

## 2023-06-12 DIAGNOSIS — D128 Benign neoplasm of rectum: Secondary | ICD-10-CM | POA: Diagnosis not present

## 2023-06-12 DIAGNOSIS — K621 Rectal polyp: Secondary | ICD-10-CM

## 2023-06-12 DIAGNOSIS — I10 Essential (primary) hypertension: Secondary | ICD-10-CM | POA: Diagnosis not present

## 2023-06-12 DIAGNOSIS — D122 Benign neoplasm of ascending colon: Secondary | ICD-10-CM | POA: Diagnosis not present

## 2023-06-12 DIAGNOSIS — E119 Type 2 diabetes mellitus without complications: Secondary | ICD-10-CM | POA: Diagnosis not present

## 2023-06-12 DIAGNOSIS — K635 Polyp of colon: Secondary | ICD-10-CM | POA: Diagnosis not present

## 2023-06-12 DIAGNOSIS — Z860101 Personal history of adenomatous and serrated colon polyps: Secondary | ICD-10-CM

## 2023-06-12 DIAGNOSIS — Z7985 Long-term (current) use of injectable non-insulin antidiabetic drugs: Secondary | ICD-10-CM | POA: Insufficient documentation

## 2023-06-12 HISTORY — PX: POLYPECTOMY: SHX5525

## 2023-06-12 HISTORY — PX: COLONOSCOPY: SHX5424

## 2023-06-12 LAB — HM COLONOSCOPY

## 2023-06-12 SURGERY — COLONOSCOPY
Anesthesia: General

## 2023-06-12 MED ORDER — LACTATED RINGERS IV SOLN: INTRAVENOUS | Status: DC | PRN

## 2023-06-12 MED ORDER — PHENYLEPHRINE 80 MCG/ML (10ML) SYRINGE FOR IV PUSH (FOR BLOOD PRESSURE SUPPORT)
PREFILLED_SYRINGE | INTRAVENOUS | Status: DC | PRN
  Administered 2023-06-12 (×4): 160 ug via INTRAVENOUS
  Administered 2023-06-12 (×2): 80 ug via INTRAVENOUS
  Administered 2023-06-12: 160 ug via INTRAVENOUS
  Administered 2023-06-12 (×2): 80 ug via INTRAVENOUS

## 2023-06-12 MED ORDER — SODIUM CHLORIDE 0.9% FLUSH: 3.0000 mL | Freq: Two times a day (BID) | INTRAVENOUS | Status: DC

## 2023-06-12 MED ORDER — LIDOCAINE HCL (PF) 2 % IJ SOLN
INTRAMUSCULAR | Status: DC | PRN
Start: 2023-06-12 — End: 2023-06-12
  Administered 2023-06-12: 50 mg via INTRADERMAL

## 2023-06-12 MED ORDER — SODIUM CHLORIDE 0.9% FLUSH: 3.0000 mL | INTRAVENOUS | Status: DC | PRN

## 2023-06-12 MED ORDER — SODIUM CHLORIDE 0.9% FLUSH
3.0000 mL | Freq: Two times a day (BID) | INTRAVENOUS | Status: DC
Start: 2023-06-12 — End: 2023-06-12

## 2023-06-12 MED ORDER — PHENYLEPHRINE 80 MCG/ML (10ML) SYRINGE FOR IV PUSH (FOR BLOOD PRESSURE SUPPORT)
PREFILLED_SYRINGE | INTRAVENOUS | Status: AC
  Filled 2023-06-12: qty 10

## 2023-06-12 MED ORDER — PROPOFOL 500 MG/50ML IV EMUL
INTRAVENOUS | Status: DC | PRN
Start: 2023-06-12 — End: 2023-06-12
  Administered 2023-06-12: 150 ug/kg/min via INTRAVENOUS
  Administered 2023-06-12: 75 ug/kg/min via INTRAVENOUS

## 2023-06-12 MED ORDER — STERILE WATER FOR IRRIGATION IR SOLN
Status: DC | PRN
  Administered 2023-06-12: 120 mL

## 2023-06-12 MED ORDER — PHENYLEPHRINE 80 MCG/ML (10ML) SYRINGE FOR IV PUSH (FOR BLOOD PRESSURE SUPPORT)
PREFILLED_SYRINGE | INTRAVENOUS | Status: AC
Start: 2023-06-12 — End: ?
  Filled 2023-06-12: qty 10

## 2023-06-12 MED ORDER — EPHEDRINE SULFATE-NACL 50-0.9 MG/10ML-% IV SOSY
PREFILLED_SYRINGE | INTRAVENOUS | Status: DC | PRN
  Administered 2023-06-12 (×2): 5 mg via INTRAVENOUS

## 2023-06-12 MED ORDER — PROPOFOL 500 MG/50ML IV EMUL
INTRAVENOUS | Status: AC
  Filled 2023-06-12: qty 250

## 2023-06-12 MED ORDER — PROPOFOL 10 MG/ML IV BOLUS
INTRAVENOUS | Status: DC | PRN
  Administered 2023-06-12: 100 mg via INTRAVENOUS

## 2023-06-12 MED ORDER — EPHEDRINE 5 MG/ML INJ
INTRAVENOUS | Status: AC
  Filled 2023-06-12: qty 5

## 2023-06-12 NOTE — Op Note (Signed)
 [facility] Patient Name: Dominic Pace Procedure Date: 06/12/2023 8:28 AM MRN: [id number] Date of Birth: [birth year] Attending MD: Katrinka Blazing , , [id number] CSN: [id number] Age: 68 Admit Type: Outpatient Procedure:                Colonoscopy Indications:              Surveillance: Personal history of adenomatous                            polyps on last colonoscopy 3 years ago Providers:                Katrinka Blazing, Angelica Ran, Elinor Parkinson Referring MD:              Medicines:                Monitored Anesthesia Care Complications:            No immediate complications. Estimated Blood Loss:     Estimated blood loss: none. Procedure:                Pre-Anesthesia Assessment:                           - Prior to the procedure, a History and Physical                            was performed, and patient medications, allergies                            and sensitivities were reviewed. The patient's                            tolerance of previous anesthesia was reviewed.                           - The risks and benefits of the procedure and the                            sedation options and risks were discussed with the                            patient. All questions were answered and informed                            consent was obtained.                           - ASA Grade Assessment: II - A patient with mild                            systemic disease.                           After obtaining informed consent, the colonoscope                            was passed under direct vision.  Throughout the                            procedure, the patient's blood pressure, pulse, and                            oxygen saturations were monitored continuously. The                            [id number]-HQ190L ([id number]) scope was introduced through                            the anus and advanced to the the cecum, identified                            by appendiceal orifice  and ileocecal valve. The                            colonoscopy was performed without difficulty. The                            patient tolerated the procedure well. The quality                            of the bowel preparation was good. Scope In: 8:44:32 AM Scope Out: 9:21:41 AM Scope Withdrawal Time: 0 hours 30 minutes 12 seconds  Total Procedure Duration: 0 hours 37 minutes 9 seconds  Findings:      The perianal and digital rectal examinations were normal.      Two sessile polyps were found in the ascending colon and cecum. The       polyps were 1 to 2 mm in size. These polyps were removed with a cold       biopsy forceps. Resection and retrieval were complete.      Three sessile polyps were found in the transverse colon and ascending       colon. The polyps were 3 to 5 mm in size. These polyps were removed with       a cold snare. Resection and retrieval were complete.      Three sessile polyps were found in the rectum and descending colon. The       polyps were 2 to 4 mm in size. These polyps were removed with a cold       snare. Resection and retrieval were complete.      The retroflexed view of the distal rectum and anal verge was normal and       showed no anal or rectal abnormalities. Impression:               - Two 1 to 2 mm polyps in the ascending colon and                            in the cecum, removed with a cold biopsy forceps.                            Resected and retrieved.                           -  Three 3 to 5 mm polyps in the transverse colon                            and in the ascending colon, removed with a cold                            snare. Resected and retrieved.                           - Three 2 to 4 mm polyps in the rectum and in the                            descending colon, removed with a cold snare.                            Resected and retrieved.                           - The distal rectum and anal verge are normal on                             retroflexion view. Moderate Sedation:      Per Anesthesia Care Recommendation:           - Discharge patient to home (ambulatory).                           - Resume previous diet.                           - Await pathology results.                           - Repeat colonoscopy for surveillance based on                            pathology results. Procedure Code(s):        --- Professional ---                           [phone], Colonoscopy, flexible; with removal of                            tumor(s), polyp(s), or other lesion(s) by snare                            technique                           [id number], 59, Colonoscopy, flexible; with biopsy,                            single or multiple Diagnosis Code(s):        --- Professional ---  Z86.010, Personal history of colonic polyps                           D12.0, Benign neoplasm of cecum                           D12.3, Benign neoplasm of transverse colon (hepatic                            flexure or splenic flexure)                           D12.2, Benign neoplasm of ascending colon                           D12.8, Benign neoplasm of rectum                           D12.4, Benign neoplasm of descending colon CPT copyright 2022 American Medical Association. All rights reserved. The codes documented in this report are preliminary and upon coder review may  be revised to meet current compliance requirements. Katrinka Blazing, MD Katrinka Blazing,  06/12/2023 9:28:25 AM This report has been signed electronically. Number of Addenda: 0

## 2023-06-12 NOTE — H&P (Signed)
 Dominic Pace is an 68 y.o. male.   Chief Complaint: history colon polyps HPI: 68 y/o M with PMH DM, coming for history of colon polyps.  Patient had colonoscopy in 2022, had 6 tubular adenomas removed.    The patient denies having any complaints such as melena, hematochezia, abdominal pain or distention, change in her bowel movement consistency or frequency, no changes in weight recently.  No family history of colorectal cancer.   Past Medical History:  Diagnosis Date   Basal cell carcinoma (BCC) of skin of face    Hypertension    Type 2 diabetes, diet controlled [facility])     Past Surgical History:  Procedure Laterality Date   BIOPSY  06/02/2020   Procedure: BIOPSY;  Surgeon: Malissa Hippo, MD;  Location: [facility];  Service: Endoscopy;;  gastric   COLONOSCOPY WITH PROPOFOL N/A 06/02/2020   Procedure: COLONOSCOPY WITH PROPOFOL;  Surgeon: Malissa Hippo, MD;  Location: [facility];  Service: Endoscopy;  Laterality: N/A;  AM   ESOPHAGEAL DILATION N/A 06/02/2020   Procedure: ESOPHAGEAL DILATION;  Surgeon: Malissa Hippo, MD;  Location: [facility];  Service: Endoscopy;  Laterality: N/A;   ESOPHAGOGASTRODUODENOSCOPY (EGD) WITH PROPOFOL N/A 06/02/2020   Procedure: ESOPHAGOGASTRODUODENOSCOPY (EGD) WITH PROPOFOL;  Surgeon: Malissa Hippo, MD;  Location: [facility];  Service: Endoscopy;  Laterality: N/A;   FOOT SURGERY Bilateral    LEFT HEART CATHETERIZATION WITH CORONARY ANGIOGRAM N/A 10/20/2011   Procedure: LEFT HEART CATHETERIZATION WITH CORONARY ANGIOGRAM;  Surgeon: Thurmon Fair, MD;  Location: [facility];  Service: Cardiovascular;  Laterality: N/A;   POLYPECTOMY  06/02/2020   Procedure: POLYPECTOMY INTESTINAL;  Surgeon: Malissa Hippo, MD;  Location: [facility];  Service: Endoscopy;;    History reviewed. No pertinent family history. Social History:  reports that he has never smoked. He has never used smokeless tobacco. He reports that he does not drink alcohol and does  not use drugs.  Allergies:  Allergies  Allergen Reactions   Percocet [Oxycodone-Acetaminophen] Nausea And Vomiting    Medications Prior to Admission  Medication Sig Dispense Refill   Dapagliflozin Propanediol (FARXIGA PO) Take by mouth.     glipiZIDE (GLUCOTROL XL) 10 MG 24 hr tablet Take 10 mg by mouth daily with breakfast. (Patient not taking: Reported on 05/16/2023)     lisinopril (PRINIVIL,ZESTRIL) 20 MG tablet Take 20 mg by mouth daily.     metFORMIN (GLUCOPHAGE) 500 MG tablet Take 1,000 mg by mouth 2 (two) times daily with a meal.     pantoprazole (PROTONIX) 40 MG tablet TAKE 1 TABLET ONCE DAILY WITH BREAKFAST. 90 tablet 0   pioglitazone (ACTOS) 15 MG tablet Take 15 mg by mouth daily. (Patient not taking: Reported on 05/16/2023)     Semaglutide,0.25 or 0.5MG /DOS, (OZEMPIC, 0.25 OR 0.5 MG/DOSE,) 2 MG/1.5ML SOPN Inject 0.25 mg into the skin every 7 (seven) days. Sunday      Results for orders placed or performed during the hospital encounter of 06/11/23 (from the past 48 hours)  Basic metabolic panel     Status: Abnormal   Collection Time: 06/11/23  1:09 PM  Result Value Ref Range   Sodium 134 (L) 135 - 145 mmol/L   Potassium 4.5 3.5 - 5.1 mmol/L   Chloride 98 98 - 111 mmol/L   CO2 25 22 - 32 mmol/L   Glucose, Bld 152 (H) 70 - 99 mg/dL    Comment: Glucose reference range applies only to samples taken after  fasting for at least 8 hours.   BUN 20 8 - 23 mg/dL   Creatinine, Ser 1.61 0.61 - 1.24 mg/dL   Calcium 9.4 8.9 - 09.6 mg/dL   GFR, Estimated >04 >54 mL/min    Comment: (NOTE) Calculated using the CKD-EPI Creatinine Equation (2021)    Anion gap 11 5 - 15    Comment: Performed at [facility], [address], [place], [place] [address]   No results found.  Review of Systems  All other systems reviewed and are negative.   Blood pressure (!) 143/65, pulse 93, temperature 98.1 F (36.7 C), temperature source Oral, resp. rate 20, height 5\' 8"  (1.727 m), weight 120.2 kg, SpO2  97%. Physical Exam  GENERAL: The patient is AO x3, in no acute distress. HEENT: Head is normocephalic and atraumatic. EOMI are intact. Mouth is well hydrated and without lesions. NECK: Supple. No masses LUNGS: Clear to auscultation. No presence of rhonchi/wheezing/rales. Adequate chest expansion HEART: RRR, normal s1 and s2. ABDOMEN: Soft, nontender, no guarding, no peritoneal signs, and nondistended. BS +. No masses. EXTREMITIES: Without any cyanosis, clubbing, rash, lesions or edema. NEUROLOGIC: AOx3, no focal motor deficit. SKIN: no jaundice, no rashes  Assessment/Plan 68 y/o M with PMH DM, coming for history of colon polyps.  Patient had colonoscopy in 2022, had 6 tubular adenomas removed.  Will proceed with colonoscopy.  Dolores Frame, MD 06/12/2023, 8:16 AM

## 2023-06-12 NOTE — Anesthesia Procedure Notes (Signed)
 Date/Time: 06/12/2023 8:39 AM  Performed by: Julian Reil, CRNAPre-anesthesia Checklist: Patient identified, Emergency Drugs available, Suction available and Patient being monitored Oxygen Delivery Method: Simple face mask Induction Type: IV induction Placement Confirmation: positive ETCO2

## 2023-06-12 NOTE — Anesthesia Preprocedure Evaluation (Addendum)
 Anesthesia Evaluation  Patient identified by MRN, date of birth, ID band Patient awake    Reviewed: Allergy & Precautions, H&P , NPO status , Patient's Chart, lab work & pertinent test results, reviewed documented beta blocker date and time   Airway Mallampati: II  TM Distance: >3 FB Neck ROM: full    Dental no notable dental hx.    Pulmonary neg pulmonary ROS   Pulmonary exam normal breath sounds clear to auscultation       Cardiovascular Exercise Tolerance: Good hypertension, Normal cardiovascular exam Rhythm:regular Rate:Normal     Neuro/Psych negative neurological ROS  negative psych ROS   GI/Hepatic negative GI ROS, Neg liver ROS,,,  Endo/Other  diabetes, Well Controlled, Type 2  Class 3 obesity  Renal/GU negative Renal ROS  negative genitourinary   Musculoskeletal   Abdominal   Peds  Hematology negative hematology ROS (+)   Anesthesia Other Findings   Reproductive/Obstetrics negative OB ROS                             Anesthesia Physical Anesthesia Plan  ASA: 3  Anesthesia Plan: General   Post-op Pain Management: Minimal or no pain anticipated   Induction: Intravenous  PONV Risk Score and Plan: Propofol infusion  Airway Management Planned: Natural Airway and Nasal Cannula  Additional Equipment: None  Intra-op Plan:   Post-operative Plan:   Informed Consent: I have reviewed the patients History and Physical, chart, labs and discussed the procedure including the risks, benefits and alternatives for the proposed anesthesia with the patient or authorized representative who has indicated his/her understanding and acceptance.     Dental Advisory Given  Plan Discussed with: CRNA  Anesthesia Plan Comments:         Anesthesia Quick Evaluation

## 2023-06-12 NOTE — Anesthesia Postprocedure Evaluation (Signed)
 Anesthesia Post Note  Patient: Dominic Pace  Procedure(s) Performed: COLONOSCOPY POLYPECTOMY  Patient location during evaluation: PACU Anesthesia Type: General Level of consciousness: awake and alert Pain management: pain level controlled Vital Signs Assessment: post-procedure vital signs reviewed and stable Respiratory status: spontaneous breathing, nonlabored ventilation, respiratory function stable and patient connected to nasal cannula oxygen Cardiovascular status: blood pressure returned to baseline and stable Postop Assessment: no apparent nausea or vomiting Anesthetic complications: no   There were no known notable events for this encounter.   Last Vitals:  Vitals:   06/12/23 0926 06/12/23 0929  BP: (!) 86/36 115/64  Pulse: 91   Resp: 12   Temp: 36.4 C   SpO2: 99%     Last Pain:  Vitals:   06/12/23 0926  TempSrc: Oral  PainSc: 0-No pain                 Jermale Crass L Abdou Stocks

## 2023-06-12 NOTE — Discharge Instructions (Signed)
 You are being discharged to home.  Resume your previous diet.  We are waiting for your pathology results.  Your physician has recommended a repeat colonoscopy for surveillance based on pathology results.

## 2023-06-12 NOTE — Transfer of Care (Signed)
 Immediate Anesthesia Transfer of Care Note  Patient: Dominic Pace  Procedure(s) Performed: COLONOSCOPY POLYPECTOMY  Patient Location: Short Stay  Anesthesia Type:General  Level of Consciousness: awake, alert , and oriented  Airway & Oxygen Therapy: Patient Spontanous Breathing  Post-op Assessment: Report given to RN and Post -op Vital signs reviewed and stable  Post vital signs: Reviewed and stable  Last Vitals:  Vitals Value Taken Time  BP 86/36 06/12/23 0926  Temp 36.4 C 06/12/23 0926  Pulse 91 06/12/23 0926  Resp 12 06/12/23 0926  SpO2 99 % 06/12/23 0926    Last Pain:  Vitals:   06/12/23 0926  TempSrc: Oral  PainSc: 0-No pain         Complications: No notable events documented.

## 2023-06-13 ENCOUNTER — Encounter (INDEPENDENT_AMBULATORY_CARE_PROVIDER_SITE_OTHER): Payer: Self-pay | Admitting: *Deleted

## 2023-06-13 ENCOUNTER — Encounter (HOSPITAL_COMMUNITY): Payer: Self-pay | Admitting: Gastroenterology

## 2023-06-13 LAB — GLUCOSE, CAPILLARY: Glucose-Capillary: 117 mg/dL — ABNORMAL HIGH (ref 70–99)

## 2023-06-13 LAB — SURGICAL PATHOLOGY

## 2023-06-18 ENCOUNTER — Encounter (INDEPENDENT_AMBULATORY_CARE_PROVIDER_SITE_OTHER): Payer: Self-pay | Admitting: *Deleted

## 2023-06-22 DIAGNOSIS — M5416 Radiculopathy, lumbar region: Secondary | ICD-10-CM | POA: Diagnosis not present

## 2023-06-22 DIAGNOSIS — E6609 Other obesity due to excess calories: Secondary | ICD-10-CM | POA: Diagnosis not present

## 2023-06-22 DIAGNOSIS — E1140 Type 2 diabetes mellitus with diabetic neuropathy, unspecified: Secondary | ICD-10-CM | POA: Diagnosis not present

## 2023-06-22 DIAGNOSIS — Z6839 Body mass index (BMI) 39.0-39.9, adult: Secondary | ICD-10-CM | POA: Diagnosis not present

## 2023-07-02 DIAGNOSIS — E1142 Type 2 diabetes mellitus with diabetic polyneuropathy: Secondary | ICD-10-CM | POA: Diagnosis not present

## 2023-07-05 DIAGNOSIS — Z6839 Body mass index (BMI) 39.0-39.9, adult: Secondary | ICD-10-CM | POA: Diagnosis not present

## 2023-07-05 DIAGNOSIS — G8929 Other chronic pain: Secondary | ICD-10-CM | POA: Diagnosis not present

## 2023-07-05 DIAGNOSIS — M5442 Lumbago with sciatica, left side: Secondary | ICD-10-CM | POA: Diagnosis not present

## 2023-07-11 DIAGNOSIS — E1140 Type 2 diabetes mellitus with diabetic neuropathy, unspecified: Secondary | ICD-10-CM | POA: Diagnosis not present

## 2023-07-11 DIAGNOSIS — E782 Mixed hyperlipidemia: Secondary | ICD-10-CM | POA: Diagnosis not present

## 2023-07-31 DIAGNOSIS — E1142 Type 2 diabetes mellitus with diabetic polyneuropathy: Secondary | ICD-10-CM | POA: Diagnosis not present

## 2023-07-31 DIAGNOSIS — M5442 Lumbago with sciatica, left side: Secondary | ICD-10-CM | POA: Diagnosis not present

## 2023-07-31 DIAGNOSIS — M25552 Pain in left hip: Secondary | ICD-10-CM | POA: Diagnosis not present

## 2023-08-09 DIAGNOSIS — M1612 Unilateral primary osteoarthritis, left hip: Secondary | ICD-10-CM | POA: Diagnosis not present

## 2023-08-09 DIAGNOSIS — M25552 Pain in left hip: Secondary | ICD-10-CM | POA: Diagnosis not present

## 2023-08-11 DIAGNOSIS — E1140 Type 2 diabetes mellitus with diabetic neuropathy, unspecified: Secondary | ICD-10-CM | POA: Diagnosis not present

## 2023-08-11 DIAGNOSIS — M5416 Radiculopathy, lumbar region: Secondary | ICD-10-CM | POA: Diagnosis not present

## 2023-08-11 DIAGNOSIS — E782 Mixed hyperlipidemia: Secondary | ICD-10-CM | POA: Diagnosis not present

## 2023-10-05 DIAGNOSIS — R52 Pain, unspecified: Secondary | ICD-10-CM | POA: Diagnosis not present

## 2023-10-05 DIAGNOSIS — Z299 Encounter for prophylactic measures, unspecified: Secondary | ICD-10-CM | POA: Diagnosis not present

## 2023-10-05 DIAGNOSIS — E1169 Type 2 diabetes mellitus with other specified complication: Secondary | ICD-10-CM | POA: Diagnosis not present

## 2023-10-05 DIAGNOSIS — Z713 Dietary counseling and surveillance: Secondary | ICD-10-CM | POA: Diagnosis not present

## 2023-10-05 DIAGNOSIS — I10 Essential (primary) hypertension: Secondary | ICD-10-CM | POA: Diagnosis not present

## 2023-11-11 DIAGNOSIS — Z5320 Procedure and treatment not carried out because of patient's decision for unspecified reasons: Secondary | ICD-10-CM | POA: Diagnosis not present

## 2023-11-11 DIAGNOSIS — E782 Mixed hyperlipidemia: Secondary | ICD-10-CM | POA: Diagnosis not present

## 2023-11-11 DIAGNOSIS — E1140 Type 2 diabetes mellitus with diabetic neuropathy, unspecified: Secondary | ICD-10-CM | POA: Diagnosis not present

## 2023-11-27 DIAGNOSIS — E1169 Type 2 diabetes mellitus with other specified complication: Secondary | ICD-10-CM | POA: Diagnosis not present

## 2023-11-27 DIAGNOSIS — I10 Essential (primary) hypertension: Secondary | ICD-10-CM | POA: Diagnosis not present

## 2023-11-27 DIAGNOSIS — Z299 Encounter for prophylactic measures, unspecified: Secondary | ICD-10-CM | POA: Diagnosis not present

## 2023-11-27 DIAGNOSIS — L959 Vasculitis limited to the skin, unspecified: Secondary | ICD-10-CM | POA: Diagnosis not present

## 2023-12-04 ENCOUNTER — Ambulatory Visit (INDEPENDENT_AMBULATORY_CARE_PROVIDER_SITE_OTHER): Payer: Medicare Other | Admitting: Gastroenterology

## 2023-12-26 ENCOUNTER — Encounter (INDEPENDENT_AMBULATORY_CARE_PROVIDER_SITE_OTHER): Payer: Self-pay | Admitting: Gastroenterology
